# Patient Record
Sex: Male | Born: 1990 | Race: White | Hispanic: No | Marital: Single | Smoking: Never smoker
Health system: Southern US, Community
[De-identification: ages and names within clinical notes are randomized; demographics above are authoritative.]

## PROBLEM LIST (undated history)

## (undated) HISTORY — PX: OTHER SURGICAL HISTORY: SHX169

---

## 2011-05-05 ENCOUNTER — Encounter: Payer: Self-pay | Admitting: Internal Medicine

## 2011-05-09 ENCOUNTER — Telehealth: Payer: Self-pay | Admitting: Internal Medicine

## 2011-05-09 ENCOUNTER — Ambulatory Visit: Payer: Self-pay | Admitting: Internal Medicine

## 2011-05-09 NOTE — Telephone Encounter (Signed)
No Charge 

## 2015-02-04 ENCOUNTER — Encounter (HOSPITAL_COMMUNITY): Payer: Self-pay | Admitting: Emergency Medicine

## 2015-02-04 ENCOUNTER — Emergency Department (HOSPITAL_COMMUNITY)
Admission: EM | Admit: 2015-02-04 | Discharge: 2015-02-04 | Disposition: A | Discharge status: Home / Self Care | Payer: 59 | Attending: Emergency Medicine | Admitting: Emergency Medicine

## 2015-02-04 ENCOUNTER — Emergency Department (HOSPITAL_COMMUNITY): Payer: 59

## 2015-02-04 DIAGNOSIS — W109XXA Fall (on) (from) unspecified stairs and steps, initial encounter: Secondary | ICD-10-CM | POA: Insufficient documentation

## 2015-02-04 DIAGNOSIS — S93402A Sprain of unspecified ligament of left ankle, initial encounter: Secondary | ICD-10-CM | POA: Insufficient documentation

## 2015-02-04 DIAGNOSIS — S99922A Unspecified injury of left foot, initial encounter: Secondary | ICD-10-CM | POA: Diagnosis present

## 2015-02-04 DIAGNOSIS — Y92254 Theater (live) as the place of occurrence of the external cause: Secondary | ICD-10-CM | POA: Insufficient documentation

## 2015-02-04 DIAGNOSIS — Y939 Activity, unspecified: Secondary | ICD-10-CM | POA: Diagnosis not present

## 2015-02-04 DIAGNOSIS — Y999 Unspecified external cause status: Secondary | ICD-10-CM | POA: Insufficient documentation

## 2015-02-04 DIAGNOSIS — S82892A Other fracture of left lower leg, initial encounter for closed fracture: Secondary | ICD-10-CM | POA: Insufficient documentation

## 2015-02-04 DIAGNOSIS — Z880 Allergy status to penicillin: Secondary | ICD-10-CM | POA: Diagnosis not present

## 2015-02-04 MED ORDER — HYDROCODONE-ACETAMINOPHEN 5-325 MG PO TABS: 1.0000 | ORAL_TABLET | Freq: Four times a day (QID) | ORAL | Status: DC | PRN

## 2015-02-04 MED ORDER — IBUPROFEN 600 MG PO TABS: 600.0000 mg | ORAL_TABLET | Freq: Four times a day (QID) | ORAL | Status: AC | PRN

## 2015-02-04 NOTE — Discharge Instructions (Signed)
You have a small avulsion fracture on the front of your ankle and also likely high grade sprain. Use the RICE treatment. Please see Orthopedics in 7-10 days.   Ankle Sprain An ankle sprain is an injury to the strong, fibrous tissues (ligaments) that hold the bones of your ankle joint together.  CAUSES An ankle sprain is usually caused by a fall or by twisting your ankle. Ankle sprains most commonly occur when you step on the outer edge of your foot, and your ankle turns inward. People who participate in sports are more prone to these types of injuries.  SYMPTOMS   Pain in your ankle. The pain may be present at rest or only when you are trying to stand or walk.  Swelling.  Bruising. Bruising may develop immediately or within 1 to 2 days after your injury.  Difficulty standing or walking, particularly when turning corners or changing directions. DIAGNOSIS  Your caregiver will ask you details about your injury and perform a physical exam of your ankle to determine if you have an ankle sprain. During the physical exam, your caregiver will press on and apply pressure to specific areas of your foot and ankle. Your caregiver will try to move your ankle in certain ways. An X-ray exam may be done to be sure a bone was not broken or a ligament did not separate from one of the bones in your ankle (avulsion fracture).  TREATMENT  Certain types of braces can help stabilize your ankle. Your caregiver can make a recommendation for this. Your caregiver may recommend the use of medicine for pain. If your sprain is severe, your caregiver may refer you to a surgeon who helps to restore function to parts of your skeletal system (orthopedist) or a physical therapist. HOME CARE INSTRUCTIONS   Apply ice to your injury for 1-2 days or as directed by your caregiver. Applying ice helps to reduce inflammation and pain.  Put ice in a plastic bag.  Place a towel between your skin and the bag.  Leave the ice on for  15-20 minutes at a time, every 2 hours while you are awake.  Only take over-the-counter or prescription medicines for pain, discomfort, or fever as directed by your caregiver.  Elevate your injured ankle above the level of your heart as much as possible for 2-3 days.  If your caregiver recommends crutches, use them as instructed. Gradually put weight on the affected ankle. Continue to use crutches or a cane until you can walk without feeling pain in your ankle.  If you have a plaster splint, wear the splint as directed by your caregiver. Do not rest it on anything harder than a pillow for the first 24 hours. Do not put weight on it. Do not get it wet. You may take it off to take a shower or bath.  You may have been given an elastic bandage to wear around your ankle to provide support. If the elastic bandage is too tight (you have numbness or tingling in your foot or your foot becomes cold and blue), adjust the bandage to make it comfortable.  If you have an air splint, you may blow more air into it or let air out to make it more comfortable. You may take your splint off at night and before taking a shower or bath. Wiggle your toes in the splint several times per day to decrease swelling. SEEK MEDICAL CARE IF:   You have rapidly increasing bruising or swelling.  Your toes  feel extremely cold or you lose feeling in your foot.  Your pain is not relieved with medicine. SEEK IMMEDIATE MEDICAL CARE IF:  Your toes are numb or blue.  You have severe pain that is increasing. MAKE SURE YOU:   Understand these instructions.  Will watch your condition.  Will get help right away if you are not doing well or get worse. Document Released: 08/25/2005 Document Revised: 05/19/2012 Document Reviewed: 09/06/2011 [facility] Patient Information 2015 [place], [place]. This information is not intended to replace advice given to you by your health care provider. Make sure you discuss any questions you have with  your health care provider. RICE: Routine Care for Injuries The routine care of many injuries includes Rest, Ice, Compression, and Elevation (RICE). HOME CARE INSTRUCTIONS  Rest is needed to allow your body to heal. Routine activities can usually be resumed when comfortable. Injured tendons and bones can take up to 6 weeks to heal. Tendons are the cord-like structures that attach muscle to bone.  Ice following an injury helps keep the swelling down and reduces pain.  Put ice in a plastic bag.  Place a towel between your skin and the bag.  Leave the ice on for 15-20 minutes, 3-4 times a day, or as directed by your health care provider. Do this while awake, for the first 24 to 48 hours. After that, continue as directed by your caregiver.  Compression helps keep swelling down. It also gives support and helps with discomfort. If an elastic bandage has been applied, it should be removed and reapplied every 3 to 4 hours. It should not be applied tightly, but firmly enough to keep swelling down. Watch fingers or toes for swelling, bluish discoloration, coldness, numbness, or excessive pain. If any of these problems occur, remove the bandage and reapply loosely. Contact your caregiver if these problems continue.  Elevation helps reduce swelling and decreases pain. With extremities, such as the arms, hands, legs, and feet, the injured area should be placed near or above the level of the heart, if possible. SEEK IMMEDIATE MEDICAL CARE IF:  You have persistent pain and swelling.  You develop redness, numbness, or unexpected weakness.  Your symptoms are getting worse rather than improving after several days. These symptoms may indicate that further evaluation or further X-rays are needed. Sometimes, X-rays may not show a small broken bone (fracture) until 1 week or 10 days later. Make a follow-up appointment with your caregiver. Ask when your X-ray results will be ready. Make sure you get your X-ray  results. Document Released: 12/07/2000 Document Revised: 08/30/2013 Document Reviewed: 01/24/2011 [facility]Exit[facility] Patient Information 2015 [place], [place]. This information is not intended to replace advice given to you by your health care provider. Make sure you discuss any questions you have with your health care provider.

## 2015-02-04 NOTE — ED Provider Notes (Signed)
CSN: [id number]     Arrival date & time 02/04/15  0039 History  This chart was scribed for Derwood KaplanAnkit Elmyra Banwart, MD by Phillis HaggisGabriella Gaje, ED Scribe. This patient was seen in room B14C/B14C and patient care was started at 1:23 AM.   Chief Complaint  Patient presents with  . Foot Injury   The history is provided by the patient. No language interpreter was used.    HPI Comments: Alexander Downs is a 24 y.o. male who presents to the Emergency Department complaining of a left foot injury onset 2 hours ago. Patient states that he was [place] the movie theater and fell [place] the stairs; states that he heard a pop in the ankle and that his foot twisted behind him. He reports that he was able to bear weight but it was extremely painful to ambulate. He states that pain is worsened with ambulation. He reports taking tylenol to relief.   History reviewed. No pertinent past medical history. Past Surgical History  Procedure Laterality Date  . Tubes in ears     No family history on file. History  Substance Use Topics  . Smoking status: Never Smoker   . Smokeless tobacco: Not on file  . Alcohol Use: Yes    Review of Systems  Constitutional: Positive for activity change.  Musculoskeletal: Positive for joint swelling and arthralgias.  Skin: Negative for wound.  Neurological: Negative for weakness and numbness.   Allergies  Penicillins  Home Medications   Prior to Admission medications   Medication Sig Start Date End Date Taking? Authorizing Provider  HYDROcodone-acetaminophen (NORCO/VICODIN) 5-325 MG per tablet Take 1 tablet by mouth every 6 (six) hours as needed. 02/04/15   Derwood KaplanAnkit Keyia Moretto, MD  ibuprofen (ADVIL,MOTRIN) 600 MG tablet Take 1 tablet (600 mg total) by mouth every 6 (six) hours as needed. 02/04/15   Cristoval Teall, MD   BP 140/82 mmHg  Pulse 91  Temp(Src) 97.6 F (36.4 C) (Oral)  Resp 18  Ht 5\' 11"  (1.803 m)  Wt 185 lb (83.915 kg)  BMI 25.81 kg/m2  SpO2 99%   Physical Exam  Constitutional:  He is oriented to person, place, and time. He appears well-developed and well-nourished.  HENT:  Head: Normocephalic and atraumatic.  Eyes: EOM are normal.  Neck: Neck supple.  Cardiovascular:  Pulses:      Dorsalis pedis pulses are 2+ on the left side.  Musculoskeletal: Normal range of motion.  Left foot:Tenderness over medial malleoli- posterior aspect. Anterior ankle tenderness; tenderness with inversion of foot; tenderness with dorsiflexion of the foot; negative Thompson's test  Neurological: He is alert and oriented to person, place, and time.  Skin: Skin is warm and dry.  Psychiatric: He has a normal mood and affect. His behavior is normal.  Nursing note and vitals reviewed.   ED Course  Procedures (including critical care time) DIAGNOSTIC STUDIES: Oxygen Saturation is 99% on room air, normal by my interpretation.    COORDINATION OF CARE: 1:27 AM-Discussed treatment plan which includes x-ray, crutches, RICE techniques, and follow up with orthopedist with pt at bedside and pt agreed to plan.   Labs Review Labs Reviewed - No data to display  Imaging Review Dg Foot Complete Left  02/04/2015   CLINICAL DATA:  Injury to left foot while walking down steps. Heard pop, with pain across the metatarsals and lateral malleolus. Initial encounter.  EXAM: LEFT FOOT - COMPLETE 3+ VIEW  COMPARISON:  None.  FINDINGS: A small osseous fragment dorsal to the distal talus  may reflect an avulsion injury.  There is no additional evidence of fracture. The joint spaces are preserved. There is no evidence of talar subluxation; the subtalar joint is unremarkable in appearance.  No significant soft tissue abnormalities are seen.  IMPRESSION: Small osseous fragment dorsal to the distal talus may reflect an avulsion injury. No additional evidence of fracture.   Electronically Signed   By: Roanna Raider M.D.   On: 02/04/2015 02:12     EKG Interpretation None      MDM   Final diagnoses:  Ankle sprain,  left, initial encounter  Avulsion fracture of ankle, left, closed, initial encounter    I personally performed the services described in this documentation, which was scribed in my presence. The recorded information has been reviewed and is accurate.   Pt comes in with fall and resultant L ankle pain and swelling. Xrays show avulsion fx, and suspect high grade sprain as well. Will give ortho f/u, non weight bearing status.   Derwood Kaplan, MD 02/04/15 [phone]

## 2015-02-04 NOTE — ED Notes (Signed)
Pt stable, ambulatory, states understanding of discharge instructions 

## 2015-02-04 NOTE — ED Notes (Signed)
Pt reports he was walking down stairs and twisted L ankle. Pt sts he heard ankle pop. Slight swelling noted.

## 2016-05-06 IMAGING — DX DG FOOT COMPLETE 3+V*L*
3 series · 3 of 3 positions shown · non-contrast
Comparison: None.

CLINICAL DATA: Injury to left foot while walking down steps. Heard
pop, with pain across the metatarsals and lateral malleolus. Initial
encounter.

EXAM:
LEFT FOOT - COMPLETE 3+ VIEW

[foot ap]
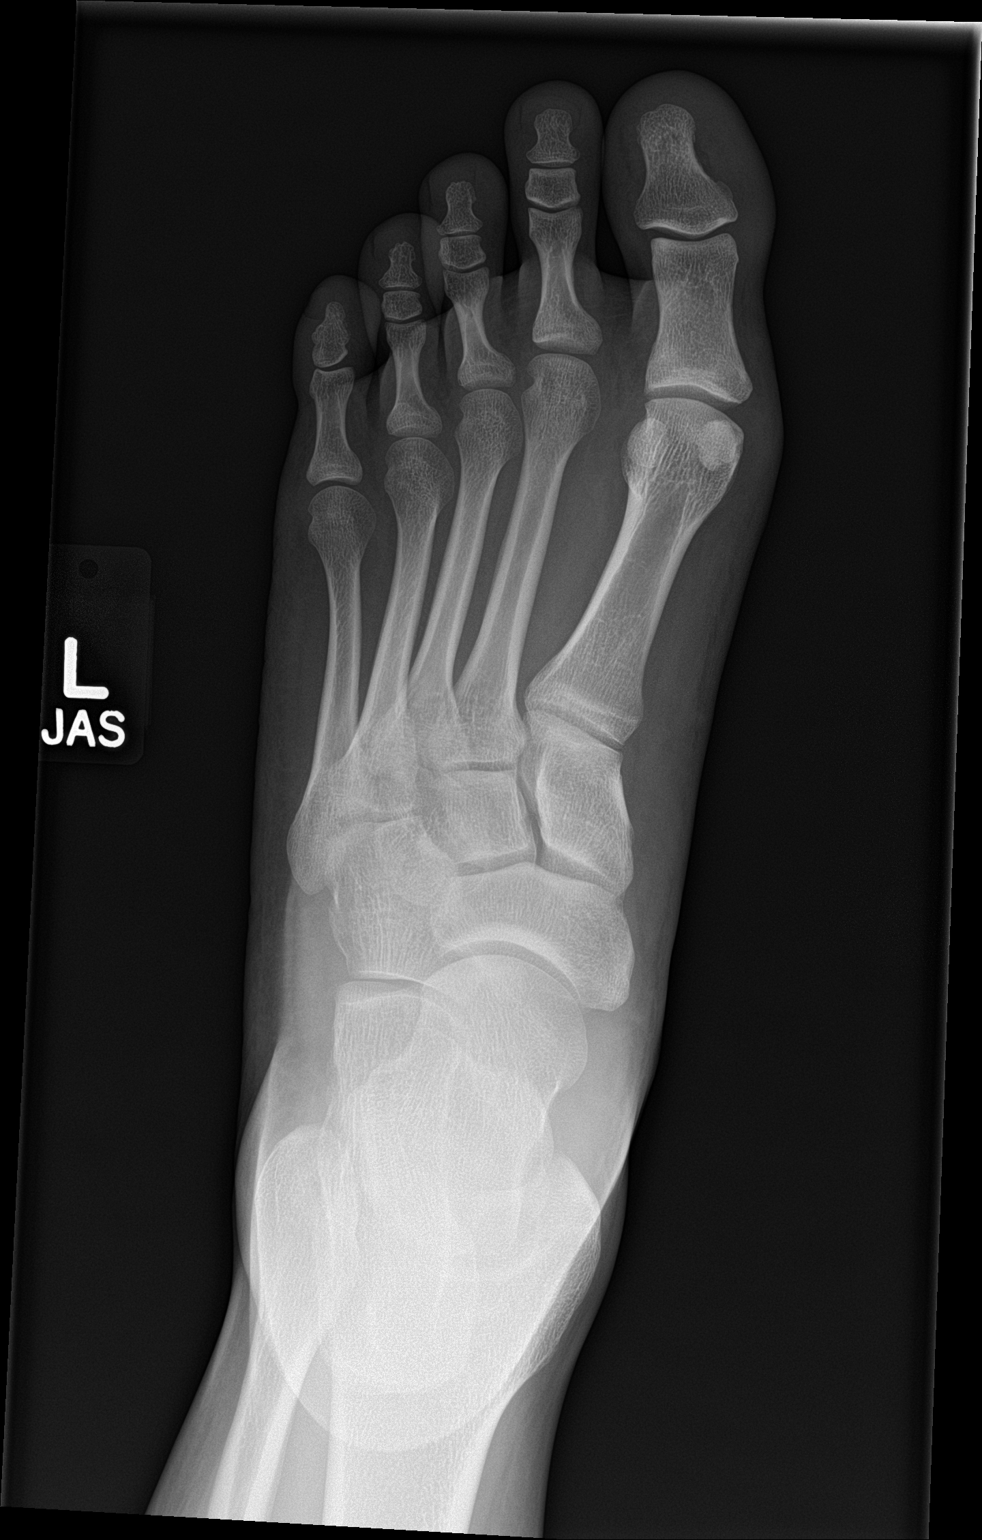

[foot obl]
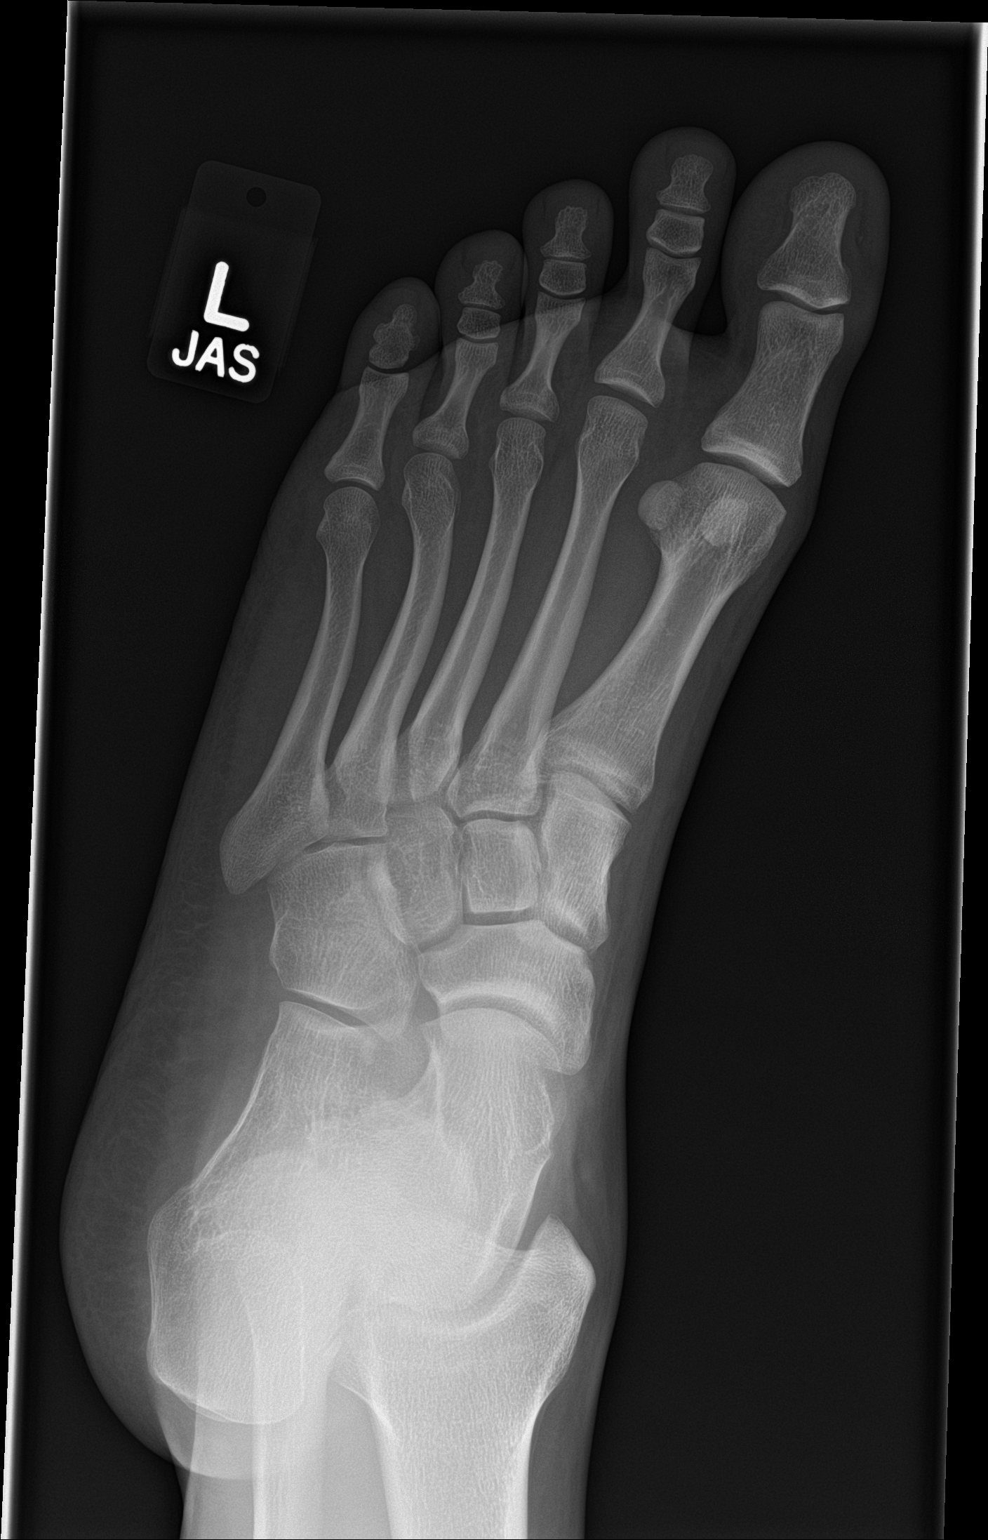

[foot lat]
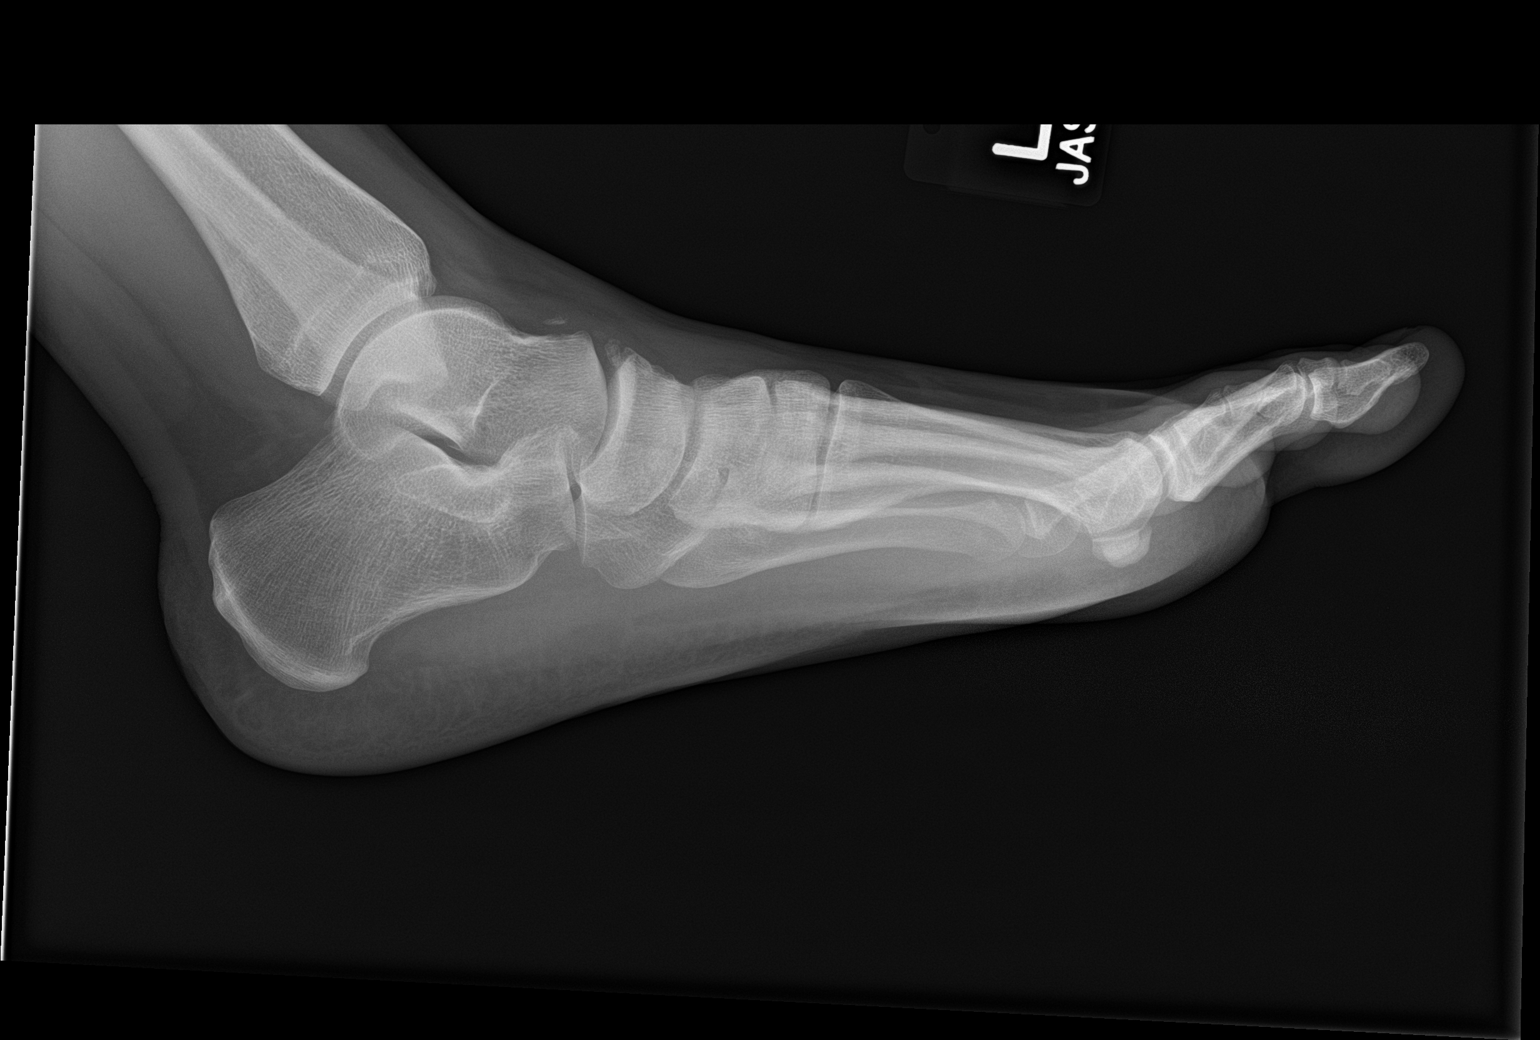

[3 of 3 positions shown; findings below may reference images not displayed]

FINDINGS: A small osseous fragment dorsal to the distal talus may reflect an
avulsion injury.

There is no additional evidence of fracture. The joint spaces are
preserved. There is no evidence of talar subluxation; the subtalar
joint is unremarkable in appearance.

No significant soft tissue abnormalities are seen.
IMPRESSION: Small osseous fragment dorsal to the distal talus may reflect an
avulsion injury. No additional evidence of fracture.

## 2016-11-27 ENCOUNTER — Observation Stay (HOSPITAL_COMMUNITY): Payer: PRIVATE HEALTH INSURANCE | Admitting: Certified Registered Nurse Anesthetist

## 2016-11-27 ENCOUNTER — Encounter (HOSPITAL_COMMUNITY)
Admission: EM | Disposition: A | Discharge status: Home / Self Care | Payer: Self-pay | Source: Home / Self Care | Attending: Emergency Medicine

## 2016-11-27 ENCOUNTER — Observation Stay (HOSPITAL_COMMUNITY)
Admission: EM | Admit: 2016-11-27 | Discharge: 2016-11-28 | Disposition: A | Discharge status: Home / Self Care | Payer: PRIVATE HEALTH INSURANCE | Attending: General Surgery | Admitting: General Surgery

## 2016-11-27 ENCOUNTER — Emergency Department (HOSPITAL_COMMUNITY): Payer: PRIVATE HEALTH INSURANCE

## 2016-11-27 ENCOUNTER — Encounter (HOSPITAL_COMMUNITY): Payer: Self-pay

## 2016-11-27 DIAGNOSIS — K3580 Unspecified acute appendicitis: Secondary | ICD-10-CM | POA: Diagnosis present

## 2016-11-27 DIAGNOSIS — R1031 Right lower quadrant pain: Secondary | ICD-10-CM | POA: Diagnosis present

## 2016-11-27 DIAGNOSIS — K353 Acute appendicitis with localized peritonitis: Secondary | ICD-10-CM | POA: Diagnosis not present

## 2016-11-27 HISTORY — PX: LAPAROSCOPIC APPENDECTOMY: SHX408

## 2016-11-27 LAB — CBC
HCT: 45.9 % (ref 39.0–52.0)
Hemoglobin: 15.6 g/dL (ref 13.0–17.0)
MCH: 32.8 pg (ref 26.0–34.0)
MCHC: 34 g/dL (ref 30.0–36.0)
MCV: 96.6 fL (ref 78.0–100.0)
PLATELETS: 194 10*3/uL (ref 150–400)
RBC: 4.75 MIL/uL (ref 4.22–5.81)
RDW: 13.3 % (ref 11.5–15.5)
WBC: 10.6 10*3/uL — ABNORMAL HIGH (ref 4.0–10.5)

## 2016-11-27 LAB — COMPREHENSIVE METABOLIC PANEL
ALK PHOS: 64 U/L (ref 38–126)
ALT: 28 U/L (ref 17–63)
AST: 25 U/L (ref 15–41)
Albumin: 4.2 g/dL (ref 3.5–5.0)
Anion gap: 7 (ref 5–15)
BUN: 15 mg/dL (ref 6–20)
CALCIUM: 9 mg/dL (ref 8.9–10.3)
CHLORIDE: 107 mmol/L (ref 101–111)
CO2: 25 mmol/L (ref 22–32)
CREATININE: 1.09 mg/dL (ref 0.61–1.24)
GFR calc Af Amer: >60 mL/min (ref >60)
GFR calc non Af Amer: >60 mL/min (ref >60)
GLUCOSE: 97 mg/dL (ref 65–99)
Potassium: 3.5 mmol/L (ref 3.5–5.1)
SODIUM: 139 mmol/L (ref 135–145)
Total Bilirubin: 1.8 mg/dL — ABNORMAL HIGH (ref 0.3–1.2)
Total Protein: 7.4 g/dL (ref 6.5–8.1)

## 2016-11-27 LAB — URINALYSIS, ROUTINE W REFLEX MICROSCOPIC
BILIRUBIN URINE: NEGATIVE
GLUCOSE, UA: NEGATIVE mg/dL
HGB URINE DIPSTICK: NEGATIVE
Ketones, ur: NEGATIVE mg/dL
Leukocytes, UA: NEGATIVE
Nitrite: NEGATIVE
PROTEIN: NEGATIVE mg/dL
Specific Gravity, Urine: 1.024 (ref 1.005–1.030)
pH: 5 (ref 5.0–8.0)

## 2016-11-27 LAB — LIPASE, BLOOD: LIPASE: 18 U/L (ref 11–51)

## 2016-11-27 SURGERY — APPENDECTOMY, LAPAROSCOPIC
Anesthesia: General | Site: Abdomen

## 2016-11-27 MED ORDER — HYDROMORPHONE HCL 1 MG/ML IJ SOLN
INTRAMUSCULAR | Status: AC
  Filled 2016-11-27: qty 1

## 2016-11-27 MED ORDER — PROPOFOL 10 MG/ML IV BOLUS
INTRAVENOUS | Status: AC
  Filled 2016-11-27: qty 20

## 2016-11-27 MED ORDER — HYDROCODONE-ACETAMINOPHEN 5-325 MG PO TABS
1.0000 | ORAL_TABLET | ORAL | Status: DC | PRN
  Administered 2016-11-27 – 2016-11-28 (×2): 1 via ORAL
  Filled 2016-11-27 (×2): qty 1

## 2016-11-27 MED ORDER — ONDANSETRON HCL 4 MG/2ML IJ SOLN
INTRAMUSCULAR | Status: DC | PRN
  Administered 2016-11-27: 4 mg via INTRAVENOUS

## 2016-11-27 MED ORDER — SODIUM CHLORIDE 0.9 % IV SOLN
INTRAVENOUS | Status: DC
  Administered 2016-11-27: 18:00:00 via INTRAVENOUS

## 2016-11-27 MED ORDER — 0.9 % SODIUM CHLORIDE (POUR BTL) OPTIME
TOPICAL | Status: DC | PRN
  Administered 2016-11-27: 1000 mL

## 2016-11-27 MED ORDER — ROCURONIUM BROMIDE 100 MG/10ML IV SOLN
INTRAVENOUS | Status: DC | PRN
  Administered 2016-11-27: 50 mg via INTRAVENOUS

## 2016-11-27 MED ORDER — METRONIDAZOLE IN NACL 5-0.79 MG/ML-% IV SOLN
500.0000 mg | Freq: Three times a day (TID) | INTRAVENOUS | Status: DC
  Administered 2016-11-27: 500 mg via INTRAVENOUS
  Filled 2016-11-27 (×3): qty 100

## 2016-11-27 MED ORDER — LIDOCAINE HCL (CARDIAC) 20 MG/ML IV SOLN
INTRAVENOUS | Status: DC | PRN
  Administered 2016-11-27: 100 mg via INTRAVENOUS

## 2016-11-27 MED ORDER — ACETAMINOPHEN 500 MG PO TABS
1000.0000 mg | ORAL_TABLET | Freq: Four times a day (QID) | ORAL | Status: DC
  Administered 2016-11-27 – 2016-11-28 (×3): 1000 mg via ORAL
  Filled 2016-11-27 (×3): qty 2

## 2016-11-27 MED ORDER — SUGAMMADEX SODIUM 200 MG/2ML IV SOLN
INTRAVENOUS | Status: DC | PRN
  Administered 2016-11-27: 200 mg via INTRAVENOUS

## 2016-11-27 MED ORDER — FENTANYL CITRATE (PF) 100 MCG/2ML IJ SOLN
INTRAMUSCULAR | Status: AC
  Filled 2016-11-27: qty 2

## 2016-11-27 MED ORDER — MORPHINE SULFATE (PF) 4 MG/ML IV SOLN: 4.0000 mg | INTRAVENOUS | Status: DC | PRN

## 2016-11-27 MED ORDER — DEXTROSE 5 % IV SOLN
2.0000 g | Freq: Once | INTRAVENOUS | Status: AC
  Administered 2016-11-27: 2 g via INTRAVENOUS
  Filled 2016-11-27: qty 2

## 2016-11-27 MED ORDER — SODIUM CHLORIDE 0.9 % IR SOLN
Status: DC | PRN
  Administered 2016-11-27: 1000 mL

## 2016-11-27 MED ORDER — CIPROFLOXACIN IN D5W 400 MG/200ML IV SOLN
400.0000 mg | Freq: Two times a day (BID) | INTRAVENOUS | Status: DC
  Administered 2016-11-27: 400 mg via INTRAVENOUS
  Filled 2016-11-27 (×3): qty 200

## 2016-11-27 MED ORDER — IOPAMIDOL (ISOVUE-300) INJECTION 61%
INTRAVENOUS | Status: AC
  Administered 2016-11-27: 100 mL
  Filled 2016-11-27: qty 100

## 2016-11-27 MED ORDER — LACTATED RINGERS IV SOLN
INTRAVENOUS | Status: DC
  Administered 2016-11-27 (×2): via INTRAVENOUS

## 2016-11-27 MED ORDER — MIDAZOLAM HCL 5 MG/5ML IJ SOLN
INTRAMUSCULAR | Status: DC | PRN
  Administered 2016-11-27: 2 mg via INTRAVENOUS

## 2016-11-27 MED ORDER — HYDROMORPHONE HCL 1 MG/ML IJ SOLN
INTRAMUSCULAR | Status: AC
  Filled 2016-11-27: qty 0.5

## 2016-11-27 MED ORDER — BUPIVACAINE HCL (PF) 0.25 % IJ SOLN
INTRAMUSCULAR | Status: DC | PRN
  Administered 2016-11-27: 10 mL

## 2016-11-27 MED ORDER — HYDROMORPHONE HCL 1 MG/ML IJ SOLN
0.2500 mg | INTRAMUSCULAR | Status: DC | PRN
  Administered 2016-11-27 (×3): 0.5 mg via INTRAVENOUS

## 2016-11-27 MED ORDER — ONDANSETRON HCL 4 MG/2ML IJ SOLN: 4.0000 mg | Freq: Four times a day (QID) | INTRAMUSCULAR | Status: DC | PRN

## 2016-11-27 MED ORDER — METRONIDAZOLE IN NACL 5-0.79 MG/ML-% IV SOLN
500.0000 mg | Freq: Once | INTRAVENOUS | Status: DC
  Administered 2016-11-27: 500 mg via INTRAVENOUS
  Filled 2016-11-27: qty 100

## 2016-11-27 MED ORDER — SUCCINYLCHOLINE CHLORIDE 20 MG/ML IJ SOLN
INTRAMUSCULAR | Status: DC | PRN
  Administered 2016-11-27: 100 mg via INTRAVENOUS

## 2016-11-27 MED ORDER — PROPOFOL 10 MG/ML IV BOLUS
INTRAVENOUS | Status: DC | PRN
  Administered 2016-11-27: 200 mg via INTRAVENOUS

## 2016-11-27 MED ORDER — MORPHINE SULFATE (PF) 2 MG/ML IV SOLN
2.0000 mg | INTRAVENOUS | Status: DC | PRN
  Administered 2016-11-27: 2 mg via INTRAVENOUS
  Filled 2016-11-27 (×2): qty 1

## 2016-11-27 MED ORDER — MIDAZOLAM HCL 2 MG/2ML IJ SOLN
INTRAMUSCULAR | Status: AC
  Filled 2016-11-27: qty 2

## 2016-11-27 MED ORDER — FENTANYL CITRATE (PF) 100 MCG/2ML IJ SOLN
INTRAMUSCULAR | Status: DC | PRN
  Administered 2016-11-27 (×4): 50 ug via INTRAVENOUS

## 2016-11-27 MED ORDER — ONDANSETRON 4 MG PO TBDP: 4.0000 mg | ORAL_TABLET | Freq: Four times a day (QID) | ORAL | Status: DC | PRN

## 2016-11-27 SURGICAL SUPPLY — 40 items
APPLIER CLIP 5 13 M/L LIGAMAX5 (MISCELLANEOUS)
CANISTER SUCT 3000ML PPV (MISCELLANEOUS) ×2 IMPLANT
CHLORAPREP W/TINT 26ML (MISCELLANEOUS) ×2 IMPLANT
CLIP APPLIE 5 13 M/L LIGAMAX5 (MISCELLANEOUS) IMPLANT
CLIP LIGATING HEMO LOK XL GOLD (MISCELLANEOUS) ×2 IMPLANT
COVER SURGICAL LIGHT HANDLE (MISCELLANEOUS) ×2 IMPLANT
DECANTER SPIKE VIAL GLASS SM (MISCELLANEOUS) ×2 IMPLANT
DERMABOND ADVANCED (GAUZE/BANDAGES/DRESSINGS) ×1
DERMABOND ADVANCED .7 DNX12 (GAUZE/BANDAGES/DRESSINGS) ×1 IMPLANT
DEVICE PMI PUNCTURE CLOSURE (MISCELLANEOUS) IMPLANT
ELECT REM PT RETURN 9FT ADLT (ELECTROSURGICAL) ×2
ELECTRODE REM PT RTRN 9FT ADLT (ELECTROSURGICAL) ×1 IMPLANT
ENDOLOOP SUT PDS II  0 18 (SUTURE)
ENDOLOOP SUT PDS II 0 18 (SUTURE) IMPLANT
GLOVE BIO SURGEON STRL SZ7.5 (GLOVE) ×2 IMPLANT
GLOVE BIOGEL PI IND STRL 6.5 (GLOVE) ×1 IMPLANT
GLOVE BIOGEL PI IND STRL 7.0 (GLOVE) ×1 IMPLANT
GLOVE BIOGEL PI INDICATOR 6.5 (GLOVE) ×1
GLOVE BIOGEL PI INDICATOR 7.0 (GLOVE) ×1
GLOVE INDICATOR 7.5 STRL GRN (GLOVE) ×2 IMPLANT
GLOVE SURG SS PI 7.0 STRL IVOR (GLOVE) ×2 IMPLANT
GOWN STRL REUS W/ TWL LRG LVL3 (GOWN DISPOSABLE) ×3 IMPLANT
GOWN STRL REUS W/TWL LRG LVL3 (GOWN DISPOSABLE) ×3
KIT BASIN OR (CUSTOM PROCEDURE TRAY) ×2 IMPLANT
KIT ROOM TURNOVER OR (KITS) ×2 IMPLANT
NS IRRIG 1000ML POUR BTL (IV SOLUTION) ×2 IMPLANT
PAD ARMBOARD 7.5X6 YLW CONV (MISCELLANEOUS) ×4 IMPLANT
POUCH RETRIEVAL ECOSAC 10 (ENDOMECHANICALS) ×1 IMPLANT
POUCH RETRIEVAL ECOSAC 10MM (ENDOMECHANICALS) ×1
SCISSORS LAP 5X35 DISP (ENDOMECHANICALS) ×2 IMPLANT
SET IRRIG TUBING LAPAROSCOPIC (IRRIGATION / IRRIGATOR) ×2 IMPLANT
SPECIMEN JAR SMALL (MISCELLANEOUS) ×2 IMPLANT
SUT MNCRL AB 4-0 PS2 18 (SUTURE) ×2 IMPLANT
TOWEL OR 17X24 6PK STRL BLUE (TOWEL DISPOSABLE) ×2 IMPLANT
TOWEL OR 17X26 10 PK STRL BLUE (TOWEL DISPOSABLE) ×2 IMPLANT
TRAY FOLEY CATH 16FR SILVER (SET/KITS/TRAYS/PACK) IMPLANT
TRAY LAPAROSCOPIC MC (CUSTOM PROCEDURE TRAY) ×2 IMPLANT
TROCAR XCEL BLADELESS 5X75MML (TROCAR) ×4 IMPLANT
TROCAR XCEL NON-BLD 11X100MML (ENDOMECHANICALS) ×2 IMPLANT
TUBING INSUFFLATION (TUBING) ×2 IMPLANT

## 2016-11-27 NOTE — ED Notes (Signed)
Surgical PA at bedside  Consent signed with provider at bedside  The flagyl that was hung still needed. Looks discontinued because it came up as a pre op order and looked like a duplicate.

## 2016-11-27 NOTE — ED Notes (Signed)
[facility]

## 2016-11-27 NOTE — Transfer of Care (Signed)
Immediate Anesthesia Transfer of Care Note  Patient: Alexander RousselJames T Kathan  Procedure(s) Performed: Procedure(s): APPENDECTOMY LAPAROSCOPIC (N/A)  Patient Location: PACU  Anesthesia Type:General  Level of Consciousness: awake, alert  and oriented  Airway & Oxygen Therapy: Patient Spontanous Breathing and Patient connected to nasal cannula oxygen  Post-op Assessment: Report given to RN and Post -op Vital signs reviewed and stable  Post vital signs: Reviewed and stable  Last Vitals:  Vitals:   11/27/16 1252 11/27/16 1644  BP: 139/90   Pulse: 90   Resp: 18   Temp: 37.2 C (P) 36.7 C    Last Pain:  Vitals:   11/27/16 1252  TempSrc: Oral  PainSc:          Complications: No apparent anesthesia complications

## 2016-11-27 NOTE — ED Provider Notes (Signed)
[facility]-EMERGENCY DEPT Provider Note   CSN: [id number] Arrival date & time: 11/27/16  4098     History   Chief Complaint Chief Complaint  Patient presents with  . Abdominal Pain    HPI Alexander ZEPEDA is a 26 y.o. male.  HPI  26 y.o. male, presents to the Emergency Department today complaining of RLQ pain with onset this morning around 0430. Pt notes constant pain that originated around umbilicus and migrated to RLQ. No hx same. Worse with movement and palpation. Notes nausea without emesis. No diarrhea. No urinary symptoms. BMs unremarkable. 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 No recent fevers. Last PO intake last night around 1900. No other symptoms noted.    History reviewed. No pertinent past medical history.  There are no active problems to display for this patient.   Past Surgical History:  Procedure Laterality Date  . tubes in ears         Home Medications    Prior to Admission medications   Medication Sig Start Date End Date Taking? Authorizing Provider  HYDROcodone-acetaminophen (NORCO/VICODIN) 5-325 MG per tablet Take 1 tablet by mouth every 6 (six) hours as needed. 02/04/15   Derwood Kaplan, MD  ibuprofen (ADVIL,MOTRIN) 600 MG tablet Take 1 tablet (600 mg total) by mouth every 6 (six) hours as needed. 02/04/15   Derwood Kaplan, MD    Family History No family history on file.  Social History Social History  Substance Use Topics  . Smoking status: Never Smoker  . Smokeless tobacco: Never Used  . Alcohol use Yes     Allergies   Penicillins   Review of Systems Review of Systems ROS reviewed and all are negative for acute change except as noted in the HPI.  Physical Exam Updated Vital Signs BP (!) 155/89 (BP Location: Right Arm)   Pulse 94   Temp 98 F (36.7 C) (Oral)   Resp 18   Ht 6' (1.829 m)   Wt 113.4 kg   SpO2 99%   BMI 33.91 kg/m   Physical Exam  Constitutional: He is oriented to person,  place, and time. Vital signs are normal. He appears well-developed and well-nourished.  HENT:  Head: Normocephalic and atraumatic.  Right Ear: Hearing normal.  Left Ear: Hearing normal.  Eyes: Conjunctivae and EOM are normal. Pupils are equal, round, and reactive to light.  Neck: Normal range of motion. Neck supple.  Cardiovascular: Normal rate, regular rhythm, normal heart sounds and intact distal pulses.   Pulmonary/Chest: Effort normal and breath sounds normal.  Abdominal: Soft. Normal appearance and bowel sounds are normal. There is tenderness in the right lower quadrant. There is rebound and tenderness at McBurney's point. There is no rigidity and no guarding.  Musculoskeletal: Normal range of motion.  Neurological: He is alert and oriented to person, place, and time.  Skin: Skin is warm and dry.  Psychiatric: He has a normal mood and affect. His speech is normal and behavior is normal. Thought content normal.  Nursing note and vitals reviewed.  ED Treatments / Results  Labs (all labs ordered are listed, but only abnormal results are displayed) Labs Reviewed  COMPREHENSIVE METABOLIC PANEL - Abnormal; Notable for the following:       Result Value   Total Bilirubin 1.8 (*)    All other components within normal limits  CBC - Abnormal; Notable for the following:    WBC 10.6 (*)    All other components within  normal limits  LIPASE, BLOOD  URINALYSIS, ROUTINE W REFLEX MICROSCOPIC   EKG  EKG Interpretation None      Radiology Ct Abdomen Pelvis W Contrast  Result Date: 11/27/2016 CLINICAL DATA:  Sharp constant pain in his umbilicus. Radiates to the right lower quadrant. EXAM: CT ABDOMEN AND PELVIS WITH CONTRAST TECHNIQUE: Multidetector CT imaging of the abdomen and pelvis was performed using the standard protocol following bolus administration of intravenous contrast. CONTRAST:  100 ISOVUE-300 IOPAMIDOL (ISOVUE-300) INJECTION 61% COMPARISON:  None. FINDINGS: Lower chest: No acute  abnormality. Hepatobiliary: No focal liver abnormality is seen. No gallstones, gallbladder wall thickening, or biliary dilatation. Pancreas: Unremarkable. No pancreatic ductal dilatation or surrounding inflammatory changes. Spleen: Normal in size without focal abnormality. Adrenals/Urinary Tract: Adrenal glands are unremarkable. Kidneys are normal, without renal calculi, focal lesion, or hydronephrosis. Bladder is unremarkable. Stomach/Bowel: No bowel wall thickening or bowel dilatation. Thickened appendix measuring 13 mm in diameter with periappendiceal inflammatory changes most consistent with acute appendicitis. No pneumatosis, pneumoperitoneum or portal venous gas. Vascular/Lymphatic: No significant vascular findings are present. No enlarged abdominal or pelvic lymph nodes. Reproductive: Prostate is unremarkable. Other: No abdominal wall hernia or abnormality. No abdominopelvic ascites. Musculoskeletal: No acute osseous abnormality. No lytic or sclerotic osseous lesion. IMPRESSION: 1. Findings most consistent with acute appendicitis. Electronically Signed   By: Elige KoHetal  Patel   On: 11/27/2016 10:37    Procedures Procedures (including critical care time)  Medications Ordered in ED Medications - No data to display   Initial Impression / Assessment and Plan / ED Course  I have reviewed the triage vital signs and the nursing notes.  Pertinent labs & imaging results that were available during my care of the patient were reviewed by me and considered in my medical decision making (see chart for details).  Final Clinical Impressions(s) / ED Diagnoses  {I have reviewed and evaluated the relevant laboratory values. {I have reviewed and evaluated the relevant imaging studies.  {I have reviewed the relevant previous healthcare records.  {I obtained HPI from historian.   ED Course:  Assessment: Pt is a 26 y.o. male who presents with RLQ pain this AM 0430. Nausea no emesis. Constant. TTP and worse with  movement. No fevers. On exam, pt in NAD. Nontoxic/nonseptic appearing. VSS. Afebrile. Lungs CTA. Heart RRR. Abdomen TTP RLQ. Pos McBurney. Pos Rebound. Concern for Appy. CBC with mild leukocytosis. CMP unremarkable. CT Abdomen/Pelvis shows acute appendicitis. No perfoartion. Given Rocephin and Flagyl. Plan is to Admit. Consulted General Surgery.    Disposition/Plan:  Admit Pt acknowledges and agrees with plan  Supervising Physician Alvira MondayErin Schlossman, MD  Final diagnoses:  Acute appendicitis, unspecified acute appendicitis type    New Prescriptions New Prescriptions   No medications on file     Audry Piliyler Brenner Visconti, PA-C 11/27/16 1058    Alvira MondayErin Schlossman, MD 11/28/16 [phone]

## 2016-11-27 NOTE — Anesthesia Preprocedure Evaluation (Signed)
Anesthesia Evaluation  Patient identified by MRN, date of birth, ID band Patient awake    Reviewed: Allergy & Precautions, NPO status , Patient's Chart, lab work & pertinent test results  Airway Mallampati: II  TM Distance: >3 FB     Dental   Pulmonary neg pulmonary ROS,    breath sounds clear to auscultation       Cardiovascular negative cardio ROS   Rhythm:Regular Rate:Normal     Neuro/Psych    GI/Hepatic Neg liver ROS, GI history noted. CG   Endo/Other  negative endocrine ROS  Renal/GU negative Renal ROS     Musculoskeletal   Abdominal   Peds  Hematology negative hematology ROS (+)   Anesthesia Other Findings   Reproductive/Obstetrics                             Anesthesia Physical Anesthesia Plan  ASA: II and emergent  Anesthesia Plan: General   Post-op Pain Management:    Induction: Intravenous  Airway Management Planned: Oral ETT  Additional Equipment:   Intra-op Plan:   Post-operative Plan: Extubation in OR  Informed Consent: I have reviewed the patients History and Physical, chart, labs and discussed the procedure including the risks, benefits and alternatives for the proposed anesthesia with the patient or authorized representative who has indicated his/her understanding and acceptance.   Dental advisory given  Plan Discussed with: CRNA and Anesthesiologist  Anesthesia Plan Comments:         Anesthesia Quick Evaluation

## 2016-11-27 NOTE — Op Note (Signed)
Preoperative diagnosis: acute appendicitis with peritonitis  Postoperative diagnosis: Same   Procedure: laparoscopic appendectomy  Surgeon: Feliciana RossettiLuke Kinsinger, M.D.  Asst: none  Anesthesia: Gen.   Indications for procedure: Alexander Downs is a 26 y.o. male with symptoms of pain in right lower quadrant consistent with acute appendicitis. Confirmed by CT scan and laboratory values.  Description of procedure: The patient was brought into the operative suite, placed supine. Anesthesia was administered with endotracheal tube. The patient's left arm was tucked. All pressure points were offloaded by foam padding. The patient was prepped and draped in the usual sterile fashion.  A transverse incision was made to the left of the umbilicus and a 1mm optical entry trocar was used to gain access to that abdominal cavity.  Pneumoperitoneum was applied with high flow low pressure.  1 5mm trocar were placed in the suprapubic space and 1 5mm trocar in the LLQ.Marland Kitchen. Pneumoperitoneum was applied with high flow low pressure.  2 5mm trocars were placed, one in the suprapubic space, one in the LLQ. All trocars sites were first anesthesized with 0.25% marcaine with epinephrine. Next the patient was placed in trendelenberg, rotated to the left. The omentum was retracted cephalad. The cecum and appendix were identified. The appendix was suppurative . The base of the appendix was dissected and a window through the mesoappendix was created with blunt dissection. 12mm hemolock clip applier was used to doubly ligate the base of the appendix and mesoappendix.  The appendix was placed in a specimen bag. The pelvis and RLQ were irrigated. No purulence was seen, no inguinal hernias were present. The appendix was removed via the umbilicus. 0 vicryl was used to close the fascial defect. Pneumoperitoneum was removed, all trocars were removed. All incisions were closed with 4-0 monocryl subcuticular stitch. The patient woke from anesthesia and  was brought to PACU in stable condition.  Findings: suppurative appendicitis  Specimen: appendix  Blood loss: 30 ml  Local anesthesia: 10 ml 0.5% marcaine  Complications: none  Feliciana RossettiLuke Kinsinger, M.D. General, Bariatric, & Minimally Invasive Surgery [facility]

## 2016-11-27 NOTE — Anesthesia Procedure Notes (Signed)
Procedure Name: Intubation Date/Time: 11/27/2016 3:28 PM Performed by: Clearnce Sorrel Pre-anesthesia Checklist: Patient identified, Emergency Drugs available, Suction available, Patient being monitored and Timeout performed Patient Re-evaluated:Patient Re-evaluated prior to inductionOxygen Delivery Method: Circle system utilized Preoxygenation: Pre-oxygenation with 100% oxygen Intubation Type: IV induction and Rapid sequence Laryngoscope Size: Mac and 4 Grade View: Grade I Tube type: Oral Tube size: 7.5 mm Number of attempts: 1 Airway Equipment and Method: Stylet Placement Confirmation: ETT inserted through vocal cords under direct vision,  positive ETCO2 and breath sounds checked- equal and bilateral Secured at: 23 cm Tube secured with: Tape Dental Injury: Teeth and Oropharynx as per pre-operative assessment

## 2016-11-27 NOTE — ED Triage Notes (Signed)
Per PT, Pt started to have sharp constant pain in his umbilicus last night that radiates to the RLQ. Pt reports rebound tenders. Denies V/D, but reports nausea. Describes pain as sharp, shooting and constant.

## 2016-11-27 NOTE — Discharge Instructions (Addendum)
°LAPAROSCOPIC SURGERY: POST OP INSTRUCTIONS  °1. DIET: Follow a light bland diet the first 24 hours after arrival home, such as soup, liquids, crackers, etc. Be sure to include lots of fluids daily. Avoid fast food or heavy meals as your are more likely to get nauseated. Eat a low fat the next few days after surgery.  °2. Take your usually prescribed home medications unless otherwise directed. °3. PAIN CONTROL:  °1. Pain is best controlled by a usual combination of three different methods TOGETHER:  °1. Ice/Heat °2. Over the counter pain medication °3. Prescription pain medication °2. Most patients will experience some swelling and bruising around the incisions. Ice packs or heating pads (30-60 minutes up to 6 times a day) will help. Use ice for the first few days to help decrease swelling and bruising, then switch to heat to help relax tight/sore spots and speed recovery. Some people prefer to use ice alone, heat alone, alternating between ice & heat. Experiment to what works for you. Swelling and bruising can take several weeks to resolve.  °3. It is helpful to take an over-the-counter pain medication regularly for the first few weeks. Choose one of the following that works best for you:  °1. Naproxen (Aleve, etc) Two 220mg tabs twice a day °2. Ibuprofen (Advil, etc) Three 200mg tabs four times a day (every meal & bedtime) °3. Acetaminophen (Tylenol, etc) 500-650mg four times a day (every meal & bedtime) °4. A prescription for pain medication (such as oxycodone, hydrocodone, etc) should be given to you upon discharge. Take your pain medication as prescribed.  °1. If you are having problems/concerns with the prescription medicine (does not control pain, nausea, vomiting, rash, itching, etc), please call us [phone] to see if we need to switch you to a different pain medicine that will work better for you and/or control your side effect better. °2. If you need a refill on your pain medication, please contact  your pharmacy. They will contact our office to request authorization. Prescriptions will not be filled after 5 pm or on week-ends. °4. Avoid getting constipated. Between the surgery and the pain medications, it is common to experience some constipation. Increasing fluid intake and taking a fiber supplement (such as Metamucil, Citrucel, FiberCon, MiraLax, etc) 1-2 times a day regularly will usually help prevent this problem from occurring. A mild laxative (prune juice, Milk of Magnesia, MiraLax, etc) should be taken according to package directions if there are no bowel movements after 48 hours.  °5. Watch out for diarrhea. If you have many loose bowel movements, simplify your diet to bland foods & liquids for a few days. Stop any stool softeners and decrease your fiber supplement. Switching to mild anti-diarrheal medications (Kayopectate, Pepto Bismol) can help. If this worsens or does not improve, please call us. °6. Wash / shower every day. You may shower over the dressings as they are waterproof. Continue to shower over incision(s) after the dressing is off. If there is glue over the incisions try not to pick it off, let it fall off naturally. °7. Remove your waterproof bandages 5 days after surgery. You may leave the incision open to air. You may replace a dressing/Band-Aid to cover the incision for comfort if you wish.  °8. ACTIVITIES as tolerated:  °1. You may resume regular (light) daily activities beginning the next day--such as daily self-care, walking, climbing stairs--gradually increasing activities as tolerated. If you can walk 30 minutes without difficulty, it is safe to try more intense activity   such as jogging, treadmill, bicycling, low-impact aerobics, swimming, etc. °2. Save the most intensive and strenuous activity for last such as sit-ups, heavy lifting, contact sports, etc Refrain from any heavy lifting or straining until you are off narcotics for pain control. For the first 2-3 weeks do not lift  over 10-15lb.  °3. DO NOT PUSH THROUGH PAIN. Let pain be your guide: If it hurts to do something, don't do it. Pain is your body warning you to avoid that activity for another week until the pain goes down. °4. You may drive when you are no longer taking prescription pain medication, you can comfortably wear a seatbelt, and you can safely maneuver your car and apply brakes. °5. You may have sexual intercourse when it is comfortable.  °9. FOLLOW UP in our office  °1. Please call [facility] at [phone] to set up an appointment to see your surgeon in the office for a follow-up appointment approximately 2-3 weeks after your surgery. °2. Make sure that you call for this appointment the day you arrive home to insure a convenient appointment time. °     10. IF YOU HAVE DISABILITY OR FAMILY LEAVE FORMS, BRING THEM TO THE               OFFICE FOR PROCESSING.  ° °WHEN TO CALL US [phone]:  °1. Poor pain control °2. Reactions / problems with new medications (rash/itching, nausea, etc)  °3. Fever over 101.5 F (38.5 C) °4. Inability to urinate °5. Nausea and/or vomiting °6. Worsening swelling or bruising °7. Continued bleeding from incision. °8. Increased pain, redness, or drainage from the incision ° °The clinic staff is available to answer your questions during regular business hours (8:30am-5pm). Please don’t hesitate to call and ask to speak to one of our nurses for clinical concerns.  °If you have a medical emergency, go to the nearest emergency room or call 911.  °A surgeon from [facility] is always on call at the hospitals  ° °[facility]  °[address], [address], [place], [place] [address] ?  °MAIN: [phone] ? TOLL FREE: [phone] ?  °FAX [phone]  °[email or web address] ° ° °

## 2016-11-27 NOTE — H&P (Signed)
[facility]    H&P/Admission Note  Indication: Acute appendicitis Requesting MD: Dr. Alvira MondayErin Schlossman Admitting MD/Service: Dr. Franky MachoLuke Kinsinger/[facility]  Subjective: 26 y.o. male presented to the Emergency Department today from home after waking at 430am this morning with RLQ pain and associated nausea. Patient describes constant pain that originated around umbilicus and migrated to RLQ. No hx same. Worse with movement and palpation. No emesis or diarrhea. No urinary symptoms. BMs unremarkable. No recent fevers. Last PO intake was bottle of water at 7:00am this morning.  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 Does not smoke. Only occasional social ETOH intake.  Surgical History: Adenoidectomy (remote) and (multiple) Tympanostomy tube placements   Objective: Vital signs in last 24 hours: Temp:  [97.8 F (36.6 C)-98 F (36.7 C)] 98 F (36.7 C) (03/22 0850) Pulse Rate:  [86-95] 91 (03/22 1100) Resp:  [18-20] 18 (03/22 0850) BP: (126-155)/(70-99) 134/84 (03/22 1100) SpO2:  [98 %-100 %] 99 % (03/22 1100) Weight:  [113.4 kg (250 lb)] 113.4 kg (250 lb) (03/22 0850)    Intake/Output from previous day: No intake/output data recorded. Intake/Output this shift: No intake/output data recorded.  PE: General: pleasant, WD, WN white male who is laying in bed in NAD HEENT: head is normocephalic, atraumatic.  Sclera are not injected and not icteric. Mouth is pink and moist Heart: regular, rate, and rhythm.  Normal s1,s2. No obvious murmurs, gallops, or rubs noted.   Lungs: CTAB, no wheezes, rhonchi, or rales noted.  Respiratory effort not labored. Abd: soft, moderate RLQ tenderness with palpation, ND, hypoactive BS, no masses, hernias, or organomegaly. No clinical signs of peritonitis.  MS: all 4 extremities are symmetrical with no cyanosis, clubbing, or edema. Skin: warm and dry with no masses, lesions, or rashes Psych: A&Ox3 with an appropriate affect.  Lab Results:   Recent Labs   11/27/16 0905  WBC 10.6*  HGB 15.6  HCT 45.9  PLT 194   BMET  Recent Labs  11/27/16 0905  NA 139  K 3.5  CL 107  CO2 25  GLUCOSE 97  BUN 15  CREATININE 1.09  CALCIUM 9.0   PT/INR No results for input(s): LABPROT, INR in the last 72 hours. CMP     Component Value Date/Time   NA 139 11/27/2016 0905   K 3.5 11/27/2016 0905   CL 107 11/27/2016 0905   CO2 25 11/27/2016 0905   GLUCOSE 97 11/27/2016 0905   BUN 15 11/27/2016 0905   CREATININE 1.09 11/27/2016 0905   CALCIUM 9.0 11/27/2016 0905   PROT 7.4 11/27/2016 0905   ALBUMIN 4.2 11/27/2016 0905   AST 25 11/27/2016 0905   ALT 28 11/27/2016 0905   ALKPHOS 64 11/27/2016 0905   BILITOT 1.8 (H) 11/27/2016 0905   GFRNONAA >60 11/27/2016 0905   GFRAA >60 11/27/2016 0905   Lipase     Component Value Date/Time   LIPASE 18 11/27/2016 0905       Studies/Results: Ct Abdomen Pelvis W Contrast  Result Date: 11/27/2016 CLINICAL DATA:  Lambert ModySharp constant pain in his umbilicus. Radiates to the right lower quadrant. EXAM: CT ABDOMEN AND PELVIS WITH CONTRAST TECHNIQUE: Multidetector CT imaging of the abdomen and pelvis was performed using the standard protocol following bolus administration of intravenous contrast. CONTRAST:  100 ISOVUE-300 IOPAMIDOL (ISOVUE-300) INJECTION 61% COMPARISON:  None. FINDINGS: Lower chest: No acute abnormality. Hepatobiliary: No focal liver abnormality is seen. No gallstones, gallbladder wall thickening, or biliary dilatation. Pancreas: Unremarkable. No pancreatic ductal dilatation or surrounding inflammatory changes.  Spleen: Normal in size without focal abnormality. Adrenals/Urinary Tract: Adrenal glands are unremarkable. Kidneys are normal, without renal calculi, focal lesion, or hydronephrosis. Bladder is unremarkable. Stomach/Bowel: No bowel wall thickening or bowel dilatation. Thickened appendix measuring 13 mm in diameter with periappendiceal inflammatory changes most consistent with acute appendicitis.  No pneumatosis, pneumoperitoneum or portal venous gas. Vascular/Lymphatic: No significant vascular findings are present. No enlarged abdominal or pelvic lymph nodes. Reproductive: Prostate is unremarkable. Other: No abdominal wall hernia or abnormality. No abdominopelvic ascites. Musculoskeletal: No acute osseous abnormality. No lytic or sclerotic osseous lesion. IMPRESSION: 1. Findings most consistent with acute appendicitis. Electronically Signed   By: Elige Ko   On: 11/27/2016 10:37    Anti-infectives: Anti-infectives    Start     Dose/Rate Route Frequency Ordered Stop   11/27/16 1100  cefTRIAXone (ROCEPHIN) 2 g in dextrose 5 % 50 mL IVPB     2 g 100 mL/hr over 30 Minutes Intravenous  Once 11/27/16 1058 11/27/16 1138   11/27/16 1100  metroNIDAZOLE (FLAGYL) IVPB 500 mg  Status:  Discontinued     500 mg 100 mL/hr over 60 Minutes Intravenous  Once 11/27/16 1058 11/27/16 1202       Assessment/Plan  26 y.o. otherwise healthy male who presented to the ER with RLQ pain this morning. No indications of peritonitis or sepsis. VSS. Afebrile. CBC with mild leukocytosis. CMP unremarkable. CT Abdomen/Pelvis consistent with acute appendicitis. No evidence of perforation. Plan: Admit to general surgery with plans for laparoscopic appendectomy this afternoon. NPO. Pain management. Receiving Rocephin/Flagyl. Will be evaluated by Dr. Sheliah Hatch.    LOS: 0 days    LEE Meade Maw [facility] 11/27/2016, 12:21 PM

## 2016-11-28 ENCOUNTER — Encounter (HOSPITAL_COMMUNITY): Payer: Self-pay | Admitting: General Surgery

## 2016-11-28 LAB — CBC
HCT: 41.6 % (ref 39.0–52.0)
HEMOGLOBIN: 14.1 g/dL (ref 13.0–17.0)
MCH: 32.7 pg (ref 26.0–34.0)
MCHC: 33.9 g/dL (ref 30.0–36.0)
MCV: 96.5 fL (ref 78.0–100.0)
Platelets: 196 10*3/uL (ref 150–400)
RBC: 4.31 MIL/uL (ref 4.22–5.81)
RDW: 13 % (ref 11.5–15.5)
WBC: 8.2 10*3/uL (ref 4.0–10.5)

## 2016-11-28 LAB — HIV ANTIBODY (ROUTINE TESTING W REFLEX): HIV Screen 4th Generation wRfx: NONREACTIVE

## 2016-11-28 MED ORDER — HYDROCODONE-ACETAMINOPHEN 5-325 MG PO TABS: 1.0000 | ORAL_TABLET | Freq: Four times a day (QID) | ORAL | 0 refills | Status: AC | PRN

## 2016-11-28 MED FILL — HYDROCODON-APAP 5-325: 5-325 | 7 days supply | Qty: 15 | Fill #0

## 2016-11-28 NOTE — Discharge Summary (Signed)
  [facility] Discharge Summary   Patient ID: Alexander RousselJames T Downs MRN: [id number] DOB/AGE: [birth year] 26 y.o.  Admit date: 11/27/2016 Discharge date: 11/28/2016  Admitting Diagnosis: Acute appendicitis  Discharge Diagnosis Patient Active Problem List   Diagnosis Date Noted  . Acute appendicitis 11/27/2016    Consultants None  Imaging: Ct Abdomen Pelvis W Contrast  Result Date: 11/27/2016 CLINICAL DATA:  Sharp constant pain in his umbilicus. Radiates to the right lower quadrant. EXAM: CT ABDOMEN AND PELVIS WITH CONTRAST TECHNIQUE: Multidetector CT imaging of the abdomen and pelvis was performed using the standard protocol following bolus administration of intravenous contrast. CONTRAST:  100 ISOVUE-300 IOPAMIDOL (ISOVUE-300) INJECTION 61% COMPARISON:  None. FINDINGS: Lower chest: No acute abnormality. Hepatobiliary: No focal liver abnormality is seen. No gallstones, gallbladder wall thickening, or biliary dilatation. Pancreas: Unremarkable. No pancreatic ductal dilatation or surrounding inflammatory changes. Spleen: Normal in size without focal abnormality. Adrenals/Urinary Tract: Adrenal glands are unremarkable. Kidneys are normal, without renal calculi, focal lesion, or hydronephrosis. Bladder is unremarkable. Stomach/Bowel: No bowel wall thickening or bowel dilatation. Thickened appendix measuring 13 mm in diameter with periappendiceal inflammatory changes most consistent with acute appendicitis. No pneumatosis, pneumoperitoneum or portal venous gas. Vascular/Lymphatic: No significant vascular findings are present. No enlarged abdominal or pelvic lymph nodes. Reproductive: Prostate is unremarkable. Other: No abdominal wall hernia or abnormality. No abdominopelvic ascites. Musculoskeletal: No acute osseous abnormality. No lytic or sclerotic osseous lesion. IMPRESSION: 1. Findings most consistent with acute appendicitis. Electronically Signed   By: Elige KoHetal  Patel   On: 11/27/2016 10:37     Procedures Dr. Sheliah HatchKinsinger (11/27/16) - Laparoscopic Appendectomy  Hospital Course:  Alexander RousselJames T Downs is a 25yo male who [facility] with acute onset RLQ abdominal pain and nausea.  Workup showed acute appendicitis.  Patient was admitted and underwent procedure listed above.  Tolerated procedure well and was transferred to the floor.  Diet was advanced as tolerated.  On POD1 the patient was voiding well, tolerating diet, ambulating well, pain well controlled, vital signs stable, incisions c/d/i and felt stable for discharge home.  Patient will follow up in our office in 2-3 weeks and knows to call with questions or concerns.  He will call to confirm appointment date/time.    I have personally reviewed the patients medication history on the [place] controlled substance database.   Physical Exam: General:  Alert, NAD, pleasant, comfortable Pulm: effort normal Abd:  Soft, ND, appropriately tender, multiple lap incisions C/D/I, mild ecchymosis surrounding umbilical incision  Allergies as of 11/28/2016      Reactions   Penicillins Swelling      Medication List    TAKE these medications   HYDROcodone-acetaminophen 5-325 MG tablet Commonly known as:  NORCO/VICODIN Take 1 tablet by mouth every 6 (six) hours as needed for moderate pain. What changed:  reasons to take this   ibuprofen 600 MG tablet Commonly known as:  ADVIL,MOTRIN Take 1 tablet (600 mg total) by mouth every 6 (six) hours as needed.        Follow-up Information    [facility] [place] 3 week([facility]).   Specialty:  General Surgery Why:  We are working on your appointment, please call to confirm. Contact information: [address] [place] [phone]          Signed: Edson SnowballBROOKE A MILLER, [facility] 11/28/2016, 8:04 AM Pager: [phone] Consults: [phone] Mon-Fri 7:00 am-4:30 pm Sat-Sun 7:00 am-11:30 am

## 2016-11-28 NOTE — Anesthesia Postprocedure Evaluation (Signed)
Anesthesia Post Note  Patient: Bonney RousselJames T Merriott  Procedure(s) Performed: Procedure(s) (LRB): APPENDECTOMY LAPAROSCOPIC (N/A)  Patient location during evaluation: PACU Anesthesia Type: General Level of consciousness: awake and alert Pain management: pain level controlled Vital Signs Assessment: post-procedure vital signs reviewed and stable Respiratory status: spontaneous breathing, nonlabored ventilation, respiratory function stable and patient connected to nasal cannula oxygen Cardiovascular status: blood pressure returned to baseline and stable Postop Assessment: no signs of nausea or vomiting Anesthetic complications: no       Last Vitals:  Vitals:   11/27/16 2300 11/28/16 0452  BP: 129/78 128/76  Pulse: 86 80  Resp: 18 18  Temp: 37.1 C 36.3 C    Last Pain:  Vitals:   11/28/16 0737  TempSrc:   PainSc: 5                  Cecile HearingStephen Edward Turk

## 2016-11-28 NOTE — Progress Notes (Signed)
Alexander RousselJames T Konicki to be D/C'd  per MD order. Discussed with the patient and all questions fully answered.  VSS, Skin clean, dry and intact without evidence of skin break down, no evidence of skin tears noted.  IV catheter discontinued intact. Site without signs and symptoms of complications. Dressing and pressure applied.  An After Visit Summary was printed and given to the patient. Patient received prescription.  D/c education completed with patient/family including follow up instructions, medication list, d/c activities limitations if indicated, with other d/c instructions as indicated by MD - patient able to verbalize understanding, all questions fully answered.   Patient instructed to return to ED, call 911, or call MD for any changes in condition.   Patient to be escorted via WC, and D/C home via private auto.

## 2018-02-27 IMAGING — CT CT ABD-PELV W/ CM
2 of 4 series · 17 of 46 positions shown, 19 images · IV contrast (Omni 300)
Comparison: None.

CLINICAL DATA: Sharp constant pain in his umbilicus. Radiates to
the right lower quadrant.

EXAM:
CT ABDOMEN AND PELVIS WITH CONTRAST
TECHNIQUE: Multidetector CT imaging of the abdomen and pelvis was performed
using the standard protocol following bolus administration of
intravenous contrast.
CONTRAST:  100 FYFC2T-AFF IOPAMIDOL (FYFC2T-AFF) INJECTION 61%

[Series 3: a/p w/ 5mm · axial · 0.76mm/px · z∈[+845,+1330]mm · 14 of 107 slices shown, 16 images]
[im 5/107  soft-tissue]
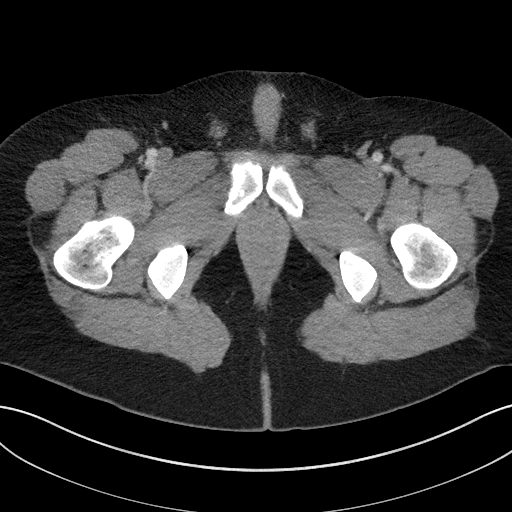
[im 5/107  bone]
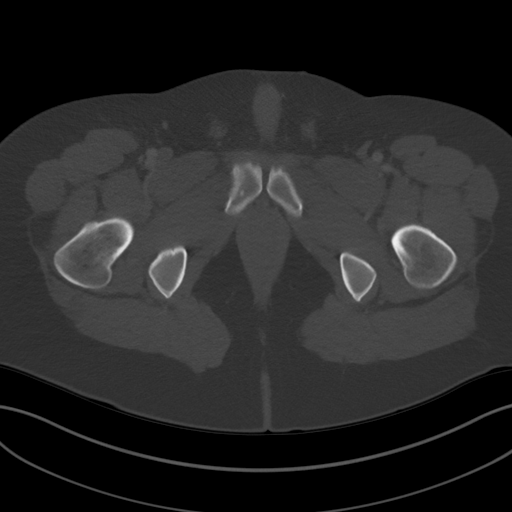
[im 13/107  soft-tissue]
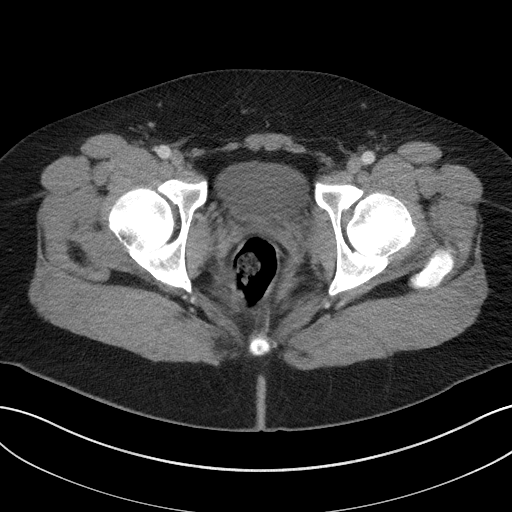
[im 21/107  soft-tissue]
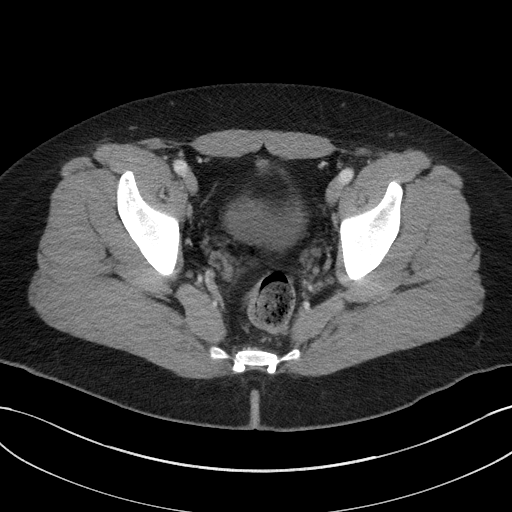
[im 29/107  soft-tissue]
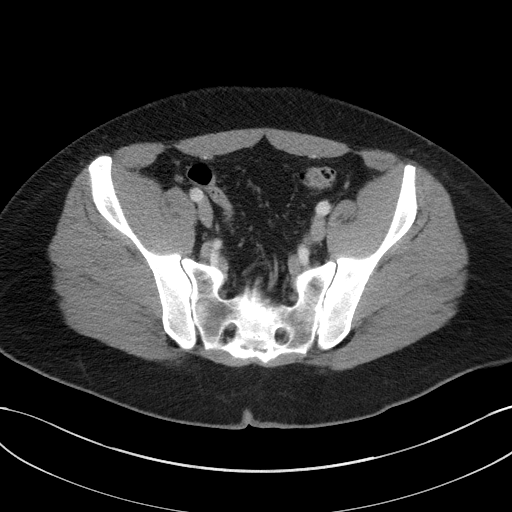
[im 37/107  soft-tissue]
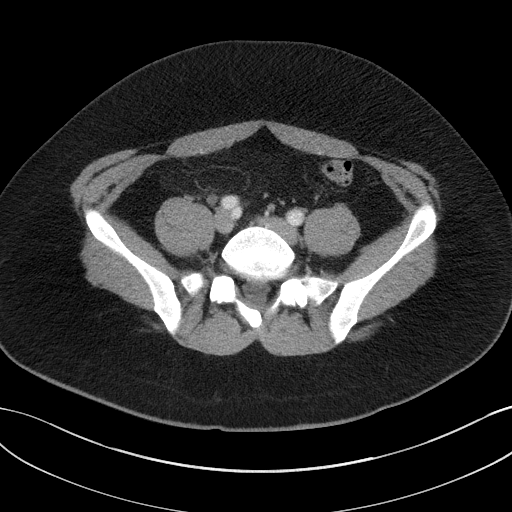
[im 41/107  soft-tissue]
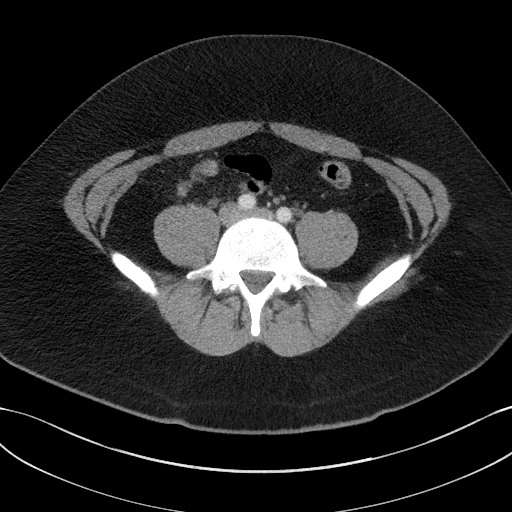
[im 49/107  soft-tissue]
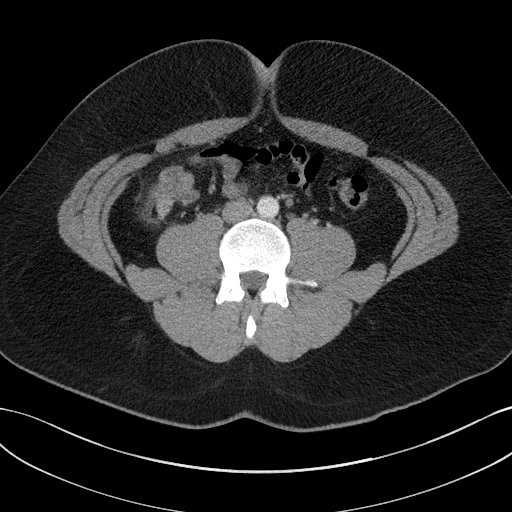
[im 58/107  soft-tissue]
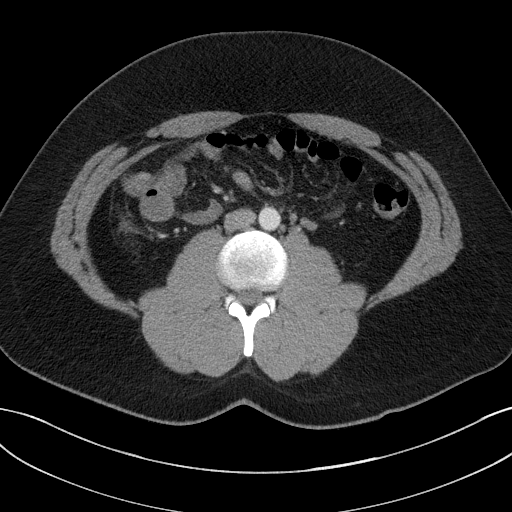
[im 66/107  soft-tissue]
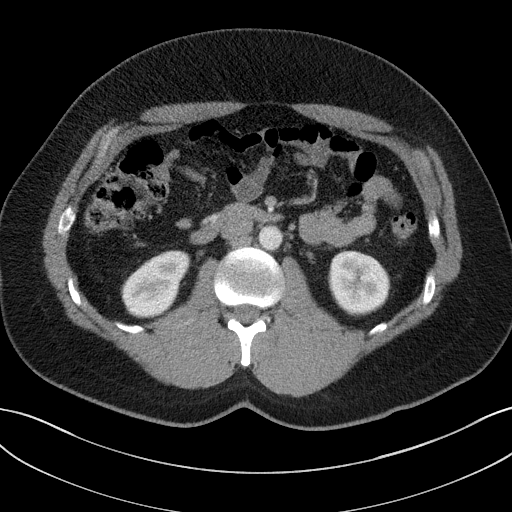
[im 66/107  bone]
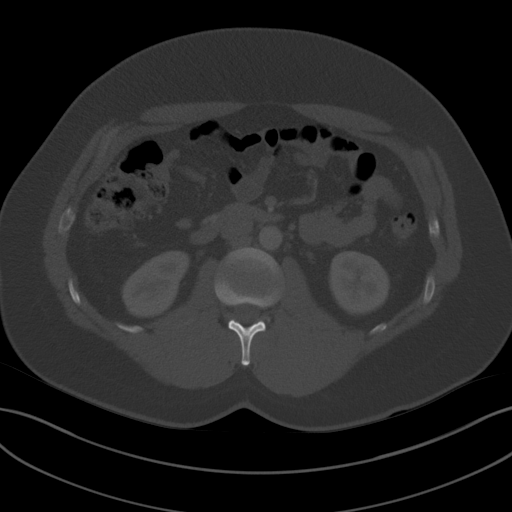
[im 70/107  soft-tissue]
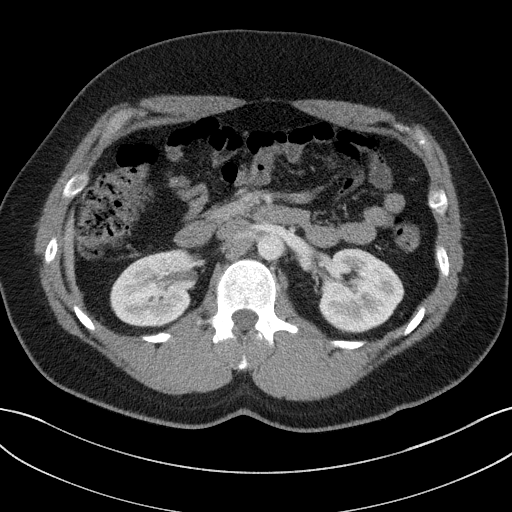
[im 78/107  soft-tissue]
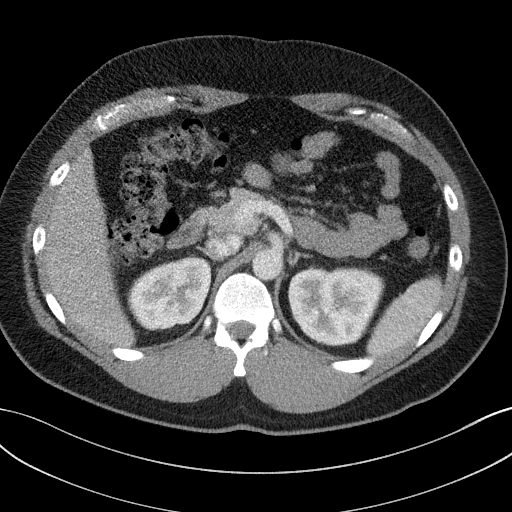
[im 86/107  soft-tissue]
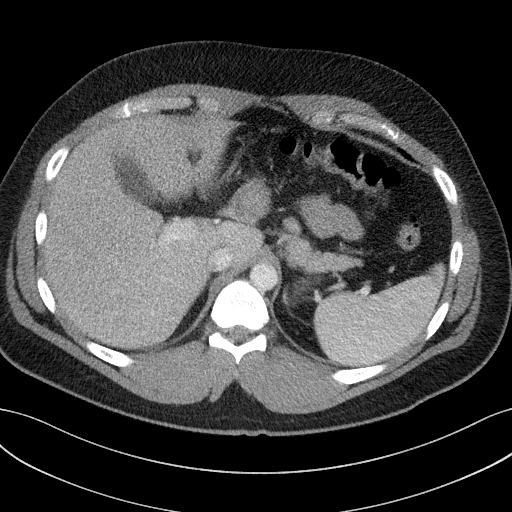
[im 94/107  soft-tissue]
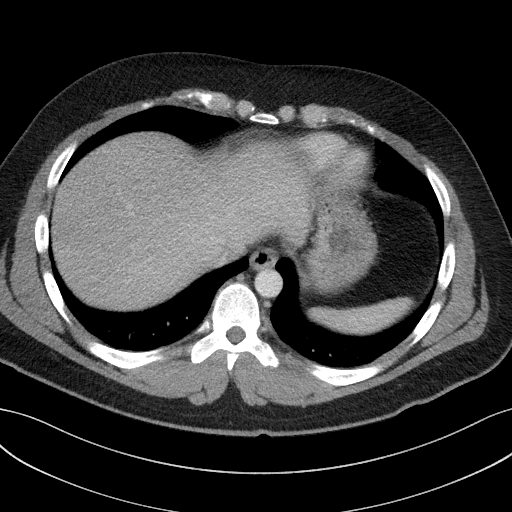
[im 102/107  soft-tissue]
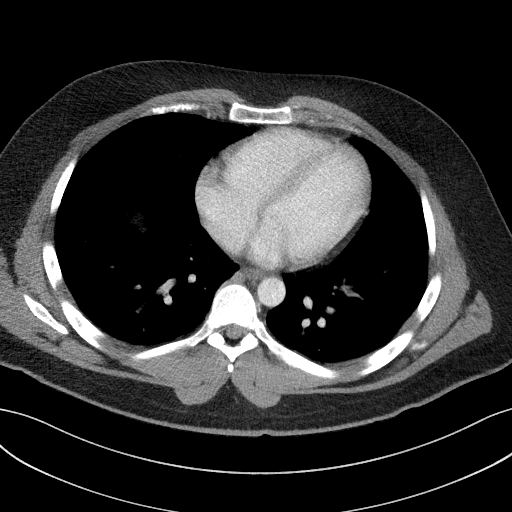

[Series 6: a/p w/ cor · coronal · 0.94mm/px · 3 of 162 slices shown]
[im 54/162  soft-tissue]
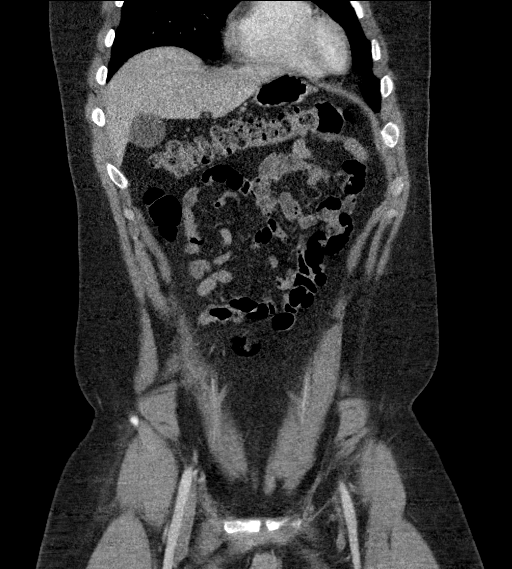
[im 72/162  soft-tissue]
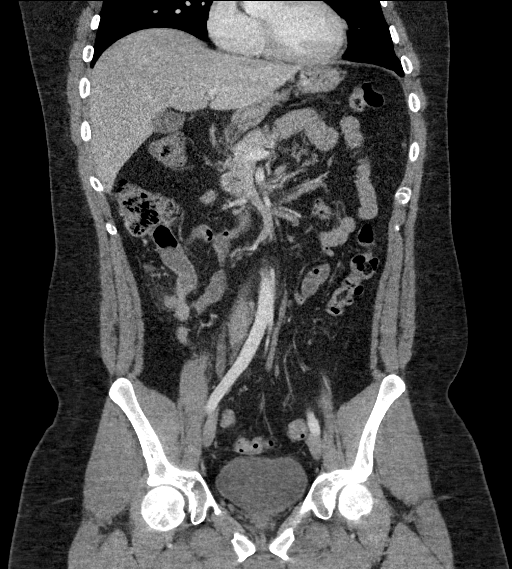
[im 90/162  soft-tissue]
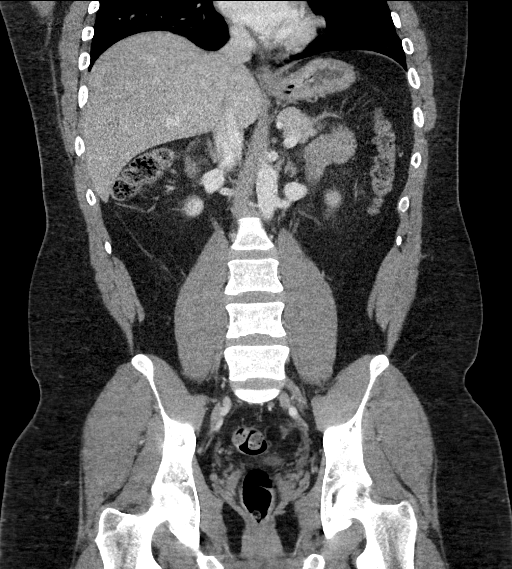

[17 of 46 positions shown; findings below may reference images not displayed]

FINDINGS: Lower chest: No acute abnormality.

Hepatobiliary: No focal liver abnormality is seen. No gallstones,
gallbladder wall thickening, or biliary dilatation.

Pancreas: Unremarkable. No pancreatic ductal dilatation or
surrounding inflammatory changes.

Spleen: Normal in size without focal abnormality.

Adrenals/Urinary Tract: Adrenal glands are unremarkable. Kidneys are
normal, without renal calculi, focal lesion, or hydronephrosis.
Bladder is unremarkable.

Stomach/Bowel: No bowel wall thickening or bowel dilatation.
Thickened appendix measuring 13 mm in diameter with periappendiceal
inflammatory changes most consistent with acute appendicitis. No
pneumatosis, pneumoperitoneum or portal venous gas.

Vascular/Lymphatic: No significant vascular findings are present. No
enlarged abdominal or pelvic lymph nodes.

Reproductive: Prostate is unremarkable.

Other: No abdominal wall hernia or abnormality. No abdominopelvic
ascites.

Musculoskeletal: No acute osseous abnormality. No lytic or sclerotic
osseous lesion.
IMPRESSION: 1. Findings most consistent with acute appendicitis.

## 2020-04-24 ENCOUNTER — Other Ambulatory Visit: Payer: Self-pay | Admitting: Infectious Diseases

## 2020-04-24 ENCOUNTER — Telehealth: Payer: Self-pay | Admitting: Infectious Diseases

## 2020-04-24 DIAGNOSIS — U071 COVID-19: Secondary | ICD-10-CM

## 2020-04-24 DIAGNOSIS — Z6832 Body mass index (BMI) 32.0-32.9, adult: Secondary | ICD-10-CM

## 2020-04-24 NOTE — Telephone Encounter (Signed)
Called to discuss with patient about Covid symptoms and the use of Regeneron, a monoclonal antibody infusion for those with mild to moderate Covid symptoms and at a high risk of hospitalization.  Pt is qualified for this infusion at the [facility] infusion center due to [employer].   Height 5\' 11"  Weight 253 BMI 32.77   Patient developed symptoms 8/12 and have been progressively worse since then. Started with basically every symptom respiratory, GI, malaise, etc. Went to ER for care and found to have non-hypoxic viral pneumonia. D/C home. Has been monitoring his oxygen at home and report > 94% consistently.   Will schedule for infusion tomorrow afternoon. Mychart sent with further details about appointment.   10/12, MSN, NP-C [facility]  [email or web address] Pager: [phone] Office: [phone] [facility] Main Line: [phone]

## 2020-04-24 NOTE — Progress Notes (Signed)
I connected by phone with Alexander Downs on 04/24/2020 at 9:46 PM to discuss the potential use of a new treatment for mild to moderate COVID-19 viral infection in non-hospitalized patients.  This patient is a 29 y.o. male that meets the FDA criteria for Emergency Use Authorization of COVID monoclonal antibody casirivimab/imdevimab.  Has a (+) direct SARS-CoV-2 viral test result  Has mild or moderate COVID-19   Is NOT hospitalized due to COVID-19  Is within 10 days of symptom onset  Has at least one of the high risk factor(s) for progression to severe COVID-19 and/or hospitalization as defined in EUA.  Specific high risk criteria : BMI > 25   I have spoken and communicated the following to the patient or parent/caregiver regarding COVID monoclonal antibody treatment:  1. FDA has authorized the emergency use for the treatment of mild to moderate COVID-19 in adults and pediatric patients with positive results of direct SARS-CoV-2 viral testing who are 76 years of age and older weighing at least 40 kg, and who are at high risk for progressing to severe COVID-19 and/or hospitalization.  2. The significant known and potential risks and benefits of COVID monoclonal antibody, and the extent to which such potential risks and benefits are unknown.  3. Information on available alternative treatments and the risks and benefits of those alternatives, including clinical trials.  4. Patients treated with COVID monoclonal antibody should continue to self-isolate and use infection control measures (e.g., wear mask, isolate, social distance, avoid sharing personal items, clean and disinfect "high touch" surfaces, and frequent handwashing) according to CDC guidelines.   5. The patient or parent/caregiver has the option to accept or refuse COVID monoclonal antibody treatment.  After reviewing this information with the patient, The patient agreed to proceed with receiving casirivimab\imdevimab infusion and will  be provided a copy of the Fact sheet prior to receiving the infusion. Alexander Downs 04/24/2020 9:46 PM

## 2020-04-25 ENCOUNTER — Ambulatory Visit (HOSPITAL_COMMUNITY)
Admission: RE | Admit: 2020-04-25 | Discharge: 2020-04-25 | Disposition: A | Discharge status: Home / Self Care | Payer: 59 | Source: Ambulatory Visit | Attending: Pulmonary Disease | Admitting: Pulmonary Disease

## 2020-04-25 DIAGNOSIS — U071 COVID-19: Secondary | ICD-10-CM | POA: Diagnosis present

## 2020-04-25 DIAGNOSIS — Z6832 Body mass index (BMI) 32.0-32.9, adult: Secondary | ICD-10-CM | POA: Diagnosis present

## 2020-04-25 MED ORDER — SODIUM CHLORIDE 0.9 % IV SOLN
1200.0000 mg | Freq: Once | INTRAVENOUS | Status: AC
  Administered 2020-04-25: 1200 mg via INTRAVENOUS
  Filled 2020-04-25: qty 10

## 2020-04-25 MED ORDER — METHYLPREDNISOLONE SODIUM SUCC 125 MG IJ SOLR: 125.0000 mg | Freq: Once | INTRAMUSCULAR | Status: DC | PRN

## 2020-04-25 MED ORDER — DIPHENHYDRAMINE HCL 50 MG/ML IJ SOLN: 50.0000 mg | Freq: Once | INTRAMUSCULAR | Status: DC | PRN

## 2020-04-25 MED ORDER — EPINEPHRINE 0.3 MG/0.3ML IJ SOAJ: 0.3000 mg | Freq: Once | INTRAMUSCULAR | Status: DC | PRN

## 2020-04-25 MED ORDER — FAMOTIDINE IN NACL 20-0.9 MG/50ML-% IV SOLN: 20.0000 mg | Freq: Once | INTRAVENOUS | Status: DC | PRN

## 2020-04-25 MED ORDER — SODIUM CHLORIDE 0.9 % IV SOLN: INTRAVENOUS | Status: DC | PRN

## 2020-04-25 MED ORDER — ALBUTEROL SULFATE HFA 108 (90 BASE) MCG/ACT IN AERS: 2.0000 | INHALATION_SPRAY | Freq: Once | RESPIRATORY_TRACT | Status: DC | PRN

## 2020-04-25 NOTE — Progress Notes (Signed)
  Diagnosis: COVID-19  Physician: Dr. Wright   Procedure: [facility] Med: casirivimab\imdevimab infusion - Provided patient with casirivimab\imdevimab fact sheet for patients, parents and caregivers prior to infusion.  Complications: No immediate complications noted.  Discharge: Discharged home   Cheick Suhr  Bell 04/25/2020    

## 2020-04-25 NOTE — Discharge Instructions (Signed)

## 2021-10-08 ENCOUNTER — Other Ambulatory Visit (HOSPITAL_COMMUNITY): Payer: Self-pay

## 2021-10-08 MED ORDER — MOUNJARO 2.5 MG/0.5ML ~~LOC~~ SOAJ
2.5000 mg | SUBCUTANEOUS | 0 refills | Status: AC
  Filled 2021-10-08 (×3): qty 2, 28d supply, fill #0

## 2021-11-01 ENCOUNTER — Other Ambulatory Visit (HOSPITAL_COMMUNITY): Payer: Self-pay

## 2022-11-24 ENCOUNTER — Other Ambulatory Visit (HOSPITAL_COMMUNITY): Payer: Self-pay

## 2022-11-24 MED ORDER — ZEPBOUND 2.5 MG/0.5ML ~~LOC~~ SOAJ
2.5000 mg | SUBCUTANEOUS | 0 refills | Status: AC
  Filled 2022-11-24: qty 2, 28d supply, fill #0

## 2022-12-26 ENCOUNTER — Other Ambulatory Visit (HOSPITAL_COMMUNITY): Payer: Self-pay

## 2022-12-26 MED ORDER — ZEPBOUND 5 MG/0.5ML ~~LOC~~ SOAJ
5.0000 mg | SUBCUTANEOUS | 0 refills | Status: AC
  Filled 2022-12-26 – 2023-01-15 (×3): qty 2, 28d supply, fill #0

## 2023-01-01 ENCOUNTER — Other Ambulatory Visit (HOSPITAL_COMMUNITY): Payer: Self-pay

## 2023-01-02 ENCOUNTER — Other Ambulatory Visit (HOSPITAL_COMMUNITY): Payer: Self-pay

## 2023-01-12 ENCOUNTER — Other Ambulatory Visit (HOSPITAL_COMMUNITY): Payer: Self-pay

## 2023-01-13 ENCOUNTER — Other Ambulatory Visit (HOSPITAL_COMMUNITY): Payer: Self-pay

## 2023-01-15 ENCOUNTER — Other Ambulatory Visit (HOSPITAL_COMMUNITY): Payer: Self-pay

## 2023-02-18 ENCOUNTER — Other Ambulatory Visit (HOSPITAL_COMMUNITY): Payer: Self-pay

## 2023-02-18 MED ORDER — ZEPBOUND 7.5 MG/0.5ML ~~LOC~~ SOAJ
7.5000 mg | SUBCUTANEOUS | 0 refills | Status: DC
  Filled 2023-02-18: qty 2, 28d supply, fill #0

## 2023-02-19 ENCOUNTER — Other Ambulatory Visit (HOSPITAL_COMMUNITY): Payer: Self-pay

## 2023-02-24 ENCOUNTER — Other Ambulatory Visit (HOSPITAL_COMMUNITY): Payer: Self-pay

## 2023-02-27 ENCOUNTER — Other Ambulatory Visit (HOSPITAL_COMMUNITY): Payer: Self-pay

## 2023-03-24 ENCOUNTER — Other Ambulatory Visit (HOSPITAL_COMMUNITY): Payer: Self-pay

## 2023-03-24 MED ORDER — ZEPBOUND 7.5 MG/0.5ML ~~LOC~~ SOAJ
7.5000 mg | SUBCUTANEOUS | 0 refills | Status: DC
  Filled 2023-03-24 (×2): qty 2, 28d supply, fill #0

## 2023-04-23 ENCOUNTER — Other Ambulatory Visit (HOSPITAL_COMMUNITY): Payer: Self-pay

## 2023-04-23 MED ORDER — ZEPBOUND 7.5 MG/0.5ML ~~LOC~~ SOAJ
7.5000 mg | SUBCUTANEOUS | 0 refills | Status: DC
  Filled 2023-04-23 – 2023-05-07 (×3): qty 2, 28d supply, fill #0

## 2023-05-05 ENCOUNTER — Other Ambulatory Visit (HOSPITAL_COMMUNITY): Payer: Self-pay

## 2023-05-07 ENCOUNTER — Other Ambulatory Visit (HOSPITAL_COMMUNITY): Payer: Self-pay

## 2023-06-09 ENCOUNTER — Other Ambulatory Visit (HOSPITAL_COMMUNITY): Payer: Self-pay

## 2023-06-09 MED ORDER — ZEPBOUND 7.5 MG/0.5ML ~~LOC~~ SOAJ
7.5000 mg | SUBCUTANEOUS | 0 refills | Status: AC
  Filled 2023-06-09 – 2024-02-12 (×3): qty 2, 28d supply, fill #0

## 2023-06-15 ENCOUNTER — Other Ambulatory Visit (HOSPITAL_COMMUNITY): Payer: Self-pay

## 2023-06-15 ENCOUNTER — Other Ambulatory Visit: Payer: Self-pay

## 2023-06-16 ENCOUNTER — Other Ambulatory Visit (HOSPITAL_COMMUNITY): Payer: Self-pay

## 2023-06-25 ENCOUNTER — Other Ambulatory Visit (HOSPITAL_COMMUNITY): Payer: Self-pay

## 2023-07-06 ENCOUNTER — Other Ambulatory Visit (HOSPITAL_COMMUNITY): Payer: Self-pay

## 2023-07-07 ENCOUNTER — Other Ambulatory Visit (HOSPITAL_COMMUNITY): Payer: Self-pay

## 2023-07-07 MED ORDER — ZEPBOUND 2.5 MG/0.5ML ~~LOC~~ SOAJ
2.5000 mg | SUBCUTANEOUS | 0 refills | Status: AC
  Filled 2023-07-07: qty 2, 28d supply, fill #0

## 2023-08-04 ENCOUNTER — Other Ambulatory Visit (HOSPITAL_COMMUNITY): Payer: Self-pay

## 2023-08-04 MED ORDER — ZEPBOUND 5 MG/0.5ML ~~LOC~~ SOAJ
5.0000 mg | SUBCUTANEOUS | 0 refills | Status: AC
  Filled 2023-08-04: qty 2, 28d supply, fill #0

## 2023-08-25 ENCOUNTER — Other Ambulatory Visit (HOSPITAL_COMMUNITY): Payer: Self-pay

## 2023-08-25 MED ORDER — AMPHETAMINE-DEXTROAMPHETAMINE 20 MG PO TABS
20.0000 mg | ORAL_TABLET | Freq: Three times a day (TID) | ORAL | 0 refills | Status: DC
  Filled 2023-08-25: qty 90, 30d supply, fill #0

## 2023-09-11 ENCOUNTER — Other Ambulatory Visit (HOSPITAL_COMMUNITY): Payer: Self-pay

## 2023-09-11 MED ORDER — ZEPBOUND 7.5 MG/0.5ML ~~LOC~~ SOAJ
7.5000 mg | SUBCUTANEOUS | 0 refills | Status: AC
  Filled 2023-09-11: qty 2, 28d supply, fill #0

## 2023-10-21 ENCOUNTER — Other Ambulatory Visit (HOSPITAL_COMMUNITY): Payer: Self-pay

## 2023-10-21 MED ORDER — ZEPBOUND 7.5 MG/0.5ML ~~LOC~~ SOAJ
7.5000 mg | SUBCUTANEOUS | 0 refills | Status: AC
  Filled 2023-10-21: qty 2, 28d supply, fill #0

## 2023-10-28 ENCOUNTER — Other Ambulatory Visit (HOSPITAL_COMMUNITY): Payer: Self-pay

## 2023-10-28 MED ORDER — AMPHETAMINE-DEXTROAMPHETAMINE 20 MG PO TABS
20.0000 mg | ORAL_TABLET | Freq: Three times a day (TID) | ORAL | 0 refills | Status: DC
  Filled 2023-10-28: qty 90, 30d supply, fill #0

## 2023-10-29 ENCOUNTER — Other Ambulatory Visit (HOSPITAL_COMMUNITY): Payer: Self-pay

## 2023-11-30 ENCOUNTER — Other Ambulatory Visit (HOSPITAL_COMMUNITY): Payer: Self-pay

## 2023-11-30 MED ORDER — ZEPBOUND 7.5 MG/0.5ML ~~LOC~~ SOAJ
7.5000 mg | SUBCUTANEOUS | 0 refills | Status: AC
  Filled 2023-11-30 (×2): qty 2, 28d supply, fill #0

## 2023-12-23 ENCOUNTER — Other Ambulatory Visit (HOSPITAL_COMMUNITY): Payer: Self-pay

## 2023-12-23 MED ORDER — AMPHETAMINE-DEXTROAMPHETAMINE 20 MG PO TABS
20.0000 mg | ORAL_TABLET | Freq: Three times a day (TID) | ORAL | 0 refills | Status: DC
  Filled 2023-12-23: qty 90, 30d supply, fill #0

## 2023-12-30 ENCOUNTER — Other Ambulatory Visit (HOSPITAL_COMMUNITY): Payer: Self-pay

## 2023-12-30 MED ORDER — ZEPBOUND 7.5 MG/0.5ML ~~LOC~~ SOAJ
7.5000 mg | SUBCUTANEOUS | 0 refills | Status: AC
  Filled 2023-12-30: qty 2, 28d supply, fill #0

## 2024-02-10 ENCOUNTER — Other Ambulatory Visit (HOSPITAL_COMMUNITY): Payer: Self-pay

## 2024-02-10 MED ORDER — AMPHETAMINE-DEXTROAMPHETAMINE 20 MG PO TABS
20.0000 mg | ORAL_TABLET | Freq: Three times a day (TID) | ORAL | 0 refills | Status: DC
  Filled 2024-02-10: qty 90, 30d supply, fill #0

## 2024-02-10 MED ORDER — ZEPBOUND 7.5 MG/0.5ML ~~LOC~~ SOAJ
7.5000 mg | SUBCUTANEOUS | 0 refills | Status: AC
  Filled 2024-02-10 – 2024-02-12 (×2): qty 2, 28d supply, fill #0

## 2024-02-12 ENCOUNTER — Other Ambulatory Visit (HOSPITAL_COMMUNITY): Payer: Self-pay

## 2024-03-28 ENCOUNTER — Other Ambulatory Visit (HOSPITAL_COMMUNITY): Payer: Self-pay

## 2024-03-28 MED ORDER — AMPHETAMINE-DEXTROAMPHETAMINE 20 MG PO TABS
20.0000 mg | ORAL_TABLET | Freq: Three times a day (TID) | ORAL | 0 refills | Status: DC
  Filled 2024-03-28: qty 90, 30d supply, fill #0

## 2024-03-30 ENCOUNTER — Other Ambulatory Visit (HOSPITAL_COMMUNITY): Payer: Self-pay

## 2024-03-30 MED ORDER — ZEPBOUND 7.5 MG/0.5ML ~~LOC~~ SOAJ
7.5000 mg | SUBCUTANEOUS | 0 refills | Status: AC
  Filled 2024-03-30 – 2024-04-12 (×2): qty 2, 28d supply, fill #0

## 2024-04-08 ENCOUNTER — Other Ambulatory Visit (HOSPITAL_COMMUNITY): Payer: Self-pay

## 2024-04-12 ENCOUNTER — Other Ambulatory Visit (HOSPITAL_COMMUNITY): Payer: Self-pay

## 2024-05-12 ENCOUNTER — Other Ambulatory Visit (HOSPITAL_COMMUNITY): Payer: Self-pay

## 2024-05-12 MED ORDER — AMPHETAMINE-DEXTROAMPHETAMINE 20 MG PO TABS
20.0000 mg | ORAL_TABLET | Freq: Three times a day (TID) | ORAL | 0 refills | Status: DC
  Filled 2024-05-12: qty 90, 30d supply, fill #0

## 2024-06-14 ENCOUNTER — Other Ambulatory Visit (HOSPITAL_COMMUNITY): Payer: Self-pay

## 2024-06-14 MED ORDER — ZEPBOUND 5 MG/0.5ML ~~LOC~~ SOAJ
5.0000 mg | SUBCUTANEOUS | 0 refills | Status: DC
  Filled 2024-06-14: qty 2, 28d supply, fill #0

## 2024-06-15 ENCOUNTER — Other Ambulatory Visit (HOSPITAL_COMMUNITY): Payer: Self-pay

## 2024-06-21 ENCOUNTER — Other Ambulatory Visit (HOSPITAL_COMMUNITY): Payer: Self-pay

## 2024-06-24 ENCOUNTER — Other Ambulatory Visit (HOSPITAL_COMMUNITY): Payer: Self-pay

## 2024-06-24 MED ORDER — AMPHETAMINE-DEXTROAMPHETAMINE 20 MG PO TABS
20.0000 mg | ORAL_TABLET | Freq: Three times a day (TID) | ORAL | 0 refills | Status: DC
  Filled 2024-06-24: qty 90, 30d supply, fill #0

## 2024-08-08 ENCOUNTER — Other Ambulatory Visit (HOSPITAL_COMMUNITY): Payer: Self-pay

## 2024-08-08 MED ORDER — AMPHETAMINE-DEXTROAMPHETAMINE 20 MG PO TABS
20.0000 mg | ORAL_TABLET | Freq: Three times a day (TID) | ORAL | 0 refills | Status: DC
  Filled 2024-08-08: qty 90, 30d supply, fill #0

## 2024-08-25 ENCOUNTER — Other Ambulatory Visit (HOSPITAL_COMMUNITY): Payer: Self-pay

## 2024-08-25 MED ORDER — ZEPBOUND 5 MG/0.5ML ~~LOC~~ SOAJ
5.0000 mg | SUBCUTANEOUS | 0 refills | Status: AC
  Filled 2024-08-25: qty 2, 28d supply, fill #0

## 2024-08-29 ENCOUNTER — Other Ambulatory Visit (HOSPITAL_COMMUNITY): Payer: Self-pay

## 2024-09-09 ENCOUNTER — Other Ambulatory Visit (HOSPITAL_COMMUNITY): Payer: Self-pay

## 2024-09-09 MED ORDER — AMPHETAMINE-DEXTROAMPHETAMINE 20 MG PO TABS
20.0000 mg | ORAL_TABLET | Freq: Three times a day (TID) | ORAL | 0 refills | Status: AC
  Filled 2024-09-09 – 2024-09-22 (×2): qty 90, 30d supply, fill #0

## 2024-09-21 ENCOUNTER — Other Ambulatory Visit (HOSPITAL_COMMUNITY): Payer: Self-pay

## 2024-09-22 ENCOUNTER — Other Ambulatory Visit (HOSPITAL_COMMUNITY): Payer: Self-pay

## 2024-09-29 ENCOUNTER — Ambulatory Visit: Admitting: Podiatry

## 2024-09-29 VITALS — Ht 72.0 in | Wt 184.0 lb

## 2024-09-29 DIAGNOSIS — B351 Tinea unguium: Secondary | ICD-10-CM | POA: Diagnosis not present

## 2024-09-29 MED ORDER — EFINACONAZOLE 10 % EX SOLN: 1.0000 [drp] | Freq: Every day | CUTANEOUS | 11 refills | Status: AC

## 2024-09-29 NOTE — Progress Notes (Signed)
"  °  Subjective:  Patient ID: Alexander Downs, male    DOB: 10-15-1990,  MRN: [id number]  Chief Complaint  Patient presents with   Nail Problem    RM 5 Patient is here for possible fungal infection of the left hallux nail. Nail has a yellowish discoloration at the tip. Symptom present for x 6 months. Pt has tried topical treatments with no relief ( 3 months).    Discussed the use of AI scribe software for clinical note transcription with the patient, who gave verbal consent to proceed.  History of Present Illness Alexander Downs is a 34 year old male who presents for evaluation of persistent bilateral hallux nail discoloration.  He first noted discoloration of his toenails in June 2025, initially attributing the changes to self-tanning product staining. The discoloration has persisted for approximately seven months, involving the proximal third of the nail plates. He recently trimmed his nails, making the discoloration less apparent at the time of visit.  He occasionally applies a clear nail strengthener and does not use nail polish regularly. He has used an over-the-counter topical antifungal for two and a half to three months, with uncertain improvement. He has observed changes in nail texture, including ridging, and has noticed similar changes on his thumbs over the past year.  He denies history of eczema, psoriasis, or anemia. He is not currently taking medication for hyperthyroidism and states his thyroid levels have been stable. He had ingrown toenails approximately twenty years ago.      Objective:    Physical Exam VASCULAR: DP and PT pulse palpable. Foot is warm and well-perfused. Capillary fill time is brisk. DERMATOLOGIC: Normal skin turgor, texture, and temperature. No open lesions, rashes, or ulcerations. Discoloration of nail plate on left hallux worse than right, 25% on right, 50% on left. Mild horizontal ridging and transverse Beau's lines without significant dystrophy. NEUROLOGIC:  Normal sensation to light touch and pressure. No paresthesias on examination. ORTHOPEDIC: Smooth pain-free range of motion of all examined joints. No ecchymosis or bruising. No gross deformity. No pain to palpation.       Results     Assessment:     ICD-10-CM   1. Onychomycosis  B35.1         Plan:  Patient was evaluated and treated and all questions answered.  Assessment and Plan Assessment & Plan Onychomycosis Early onychomycosis involving bilateral halluces, chronic for approximately seven months with minimal progression. Involvement is limited to 25% of the right and 50% of the left hallux nail plate. No significant nail dystrophy or associated skin disease. Topical therapy is appropriate given the limited extent and absence of significant nail damage. Oral antifungal therapy is not indicated. Gradual improvement is anticipated over several months with topical treatment. - Prescribed topical efinaconazole  (Jublia ) for daily application to bilateral halluces for at least 3-6 months. - Provided refills for extended treatment as needed. - Arranged for prescription fulfillment at [facility] in [place] with manufacturer copay program and mail delivery service. - Instructed him to monitor for improvement and to contact the office if the condition worsens or fails to improve. - Obtained clinical photographs to document baseline and monitor progress.      No follow-ups on file.   "
